# Patient Record
Sex: Female | Born: 1970 | Race: Asian | Hispanic: No | Marital: Married | Smoking: Never smoker
Health system: Southern US, Community
[De-identification: ages and names within clinical notes are randomized; demographics above are authoritative.]

## PROBLEM LIST (undated history)

## (undated) DIAGNOSIS — I10 Essential (primary) hypertension: Secondary | ICD-10-CM

## (undated) HISTORY — PX: TUBAL LIGATION: SHX77

---

## 2004-07-15 ENCOUNTER — Emergency Department (HOSPITAL_COMMUNITY): Admission: EM | Admit: 2004-07-15 | Discharge: 2004-07-15 | Payer: Self-pay | Admitting: Family Medicine

## 2006-07-22 ENCOUNTER — Inpatient Hospital Stay (HOSPITAL_COMMUNITY): Admission: AD | Admit: 2006-07-22 | Discharge: 2006-07-22 | Payer: Self-pay | Admitting: Obstetrics and Gynecology

## 2006-10-14 ENCOUNTER — Ambulatory Visit (HOSPITAL_COMMUNITY): Admission: RE | Admit: 2006-10-14 | Discharge: 2006-10-14 | Payer: Self-pay | Admitting: Family Medicine

## 2007-01-31 ENCOUNTER — Ambulatory Visit (HOSPITAL_COMMUNITY): Admission: RE | Admit: 2007-01-31 | Discharge: 2007-01-31 | Payer: Self-pay | Admitting: Obstetrics & Gynecology

## 2007-02-16 ENCOUNTER — Ambulatory Visit: Payer: Self-pay | Admitting: Obstetrics and Gynecology

## 2007-02-20 ENCOUNTER — Encounter: Payer: Self-pay | Admitting: Obstetrics and Gynecology

## 2007-02-20 ENCOUNTER — Ambulatory Visit: Payer: Self-pay | Admitting: Obstetrics and Gynecology

## 2007-02-20 ENCOUNTER — Inpatient Hospital Stay (HOSPITAL_COMMUNITY): Admission: AD | Admit: 2007-02-20 | Discharge: 2007-02-23 | Payer: Self-pay | Admitting: Obstetrics and Gynecology

## 2008-08-13 ENCOUNTER — Emergency Department (HOSPITAL_COMMUNITY): Admission: EM | Admit: 2008-08-13 | Discharge: 2008-08-13 | Payer: Self-pay | Admitting: Emergency Medicine

## 2008-08-25 IMAGING — US US OB COMP LESS 14 WK
1 series · 14 of 28 positions shown · non-contrast
Comparison: none

CLINICAL DATA: 35-year-old female with positive pregnancy test with estimated gestation of 5 weeks by LMP.  Pelvic pain and spotting.  
 OBSTETRICAL ULTRASOUND <14 WKS:
TECHNIQUE: Transabdominal ultrasound was performed for evaluation of the gestation as well as the maternal uterus and adnexal regions.

[Series 1: us ob comp less 14 wk · 0.22mm/px · 14 of 28 slices shown]
[im 2/28]
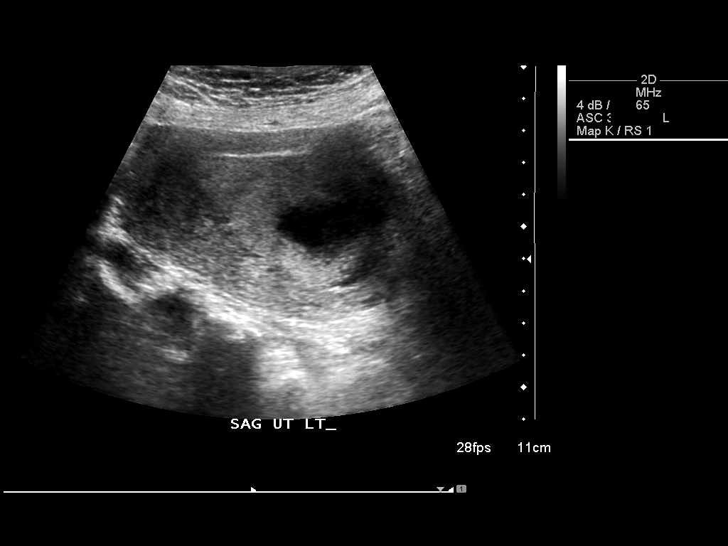
[im 4/28]
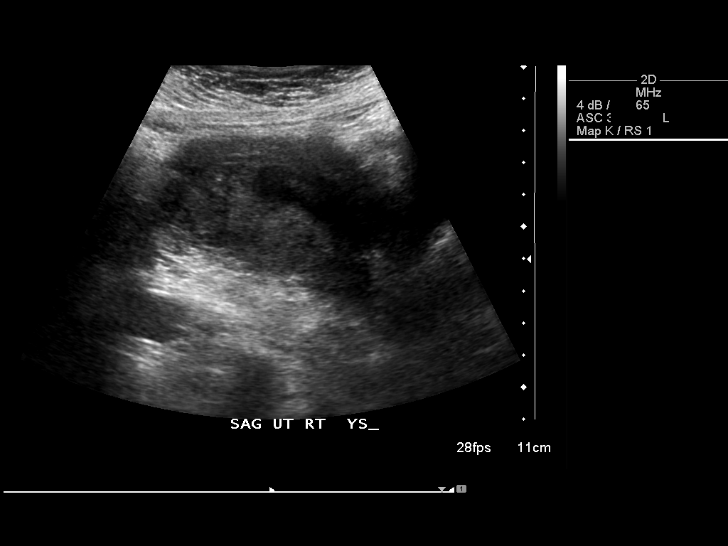
[im 6/28]
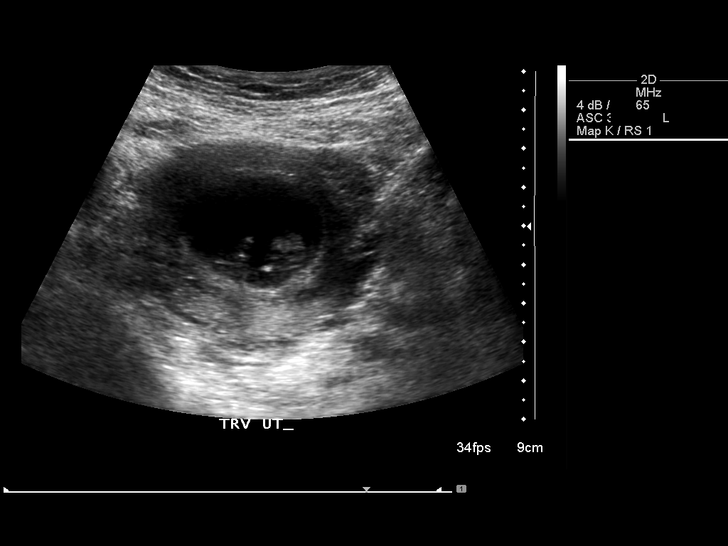
[im 8/28]
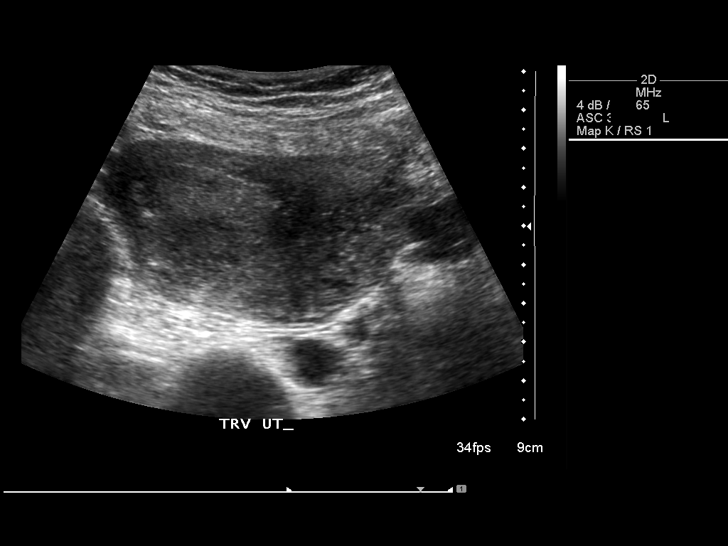
[im 10/28]
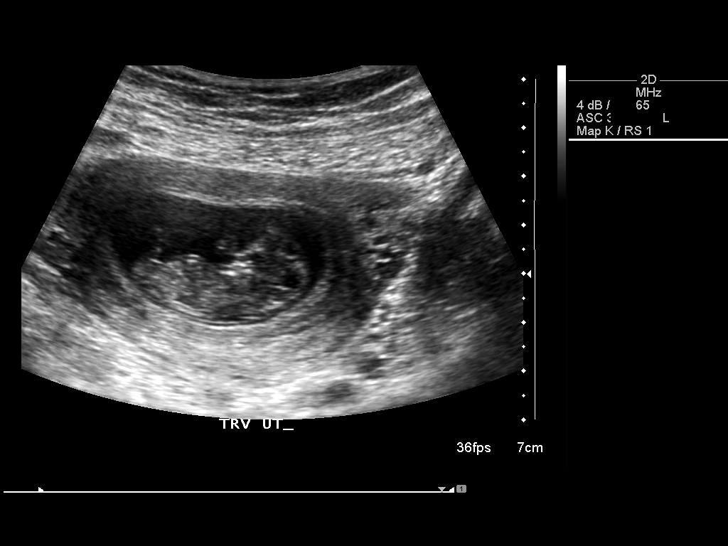
[im 12/28]
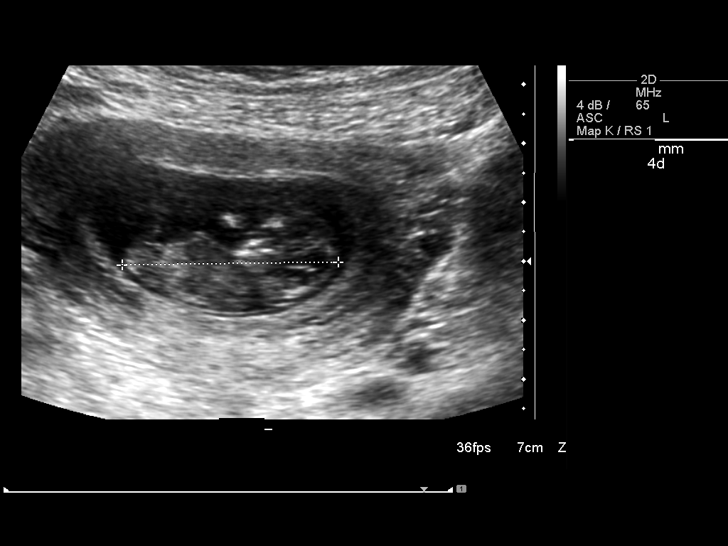
[im 14/28]
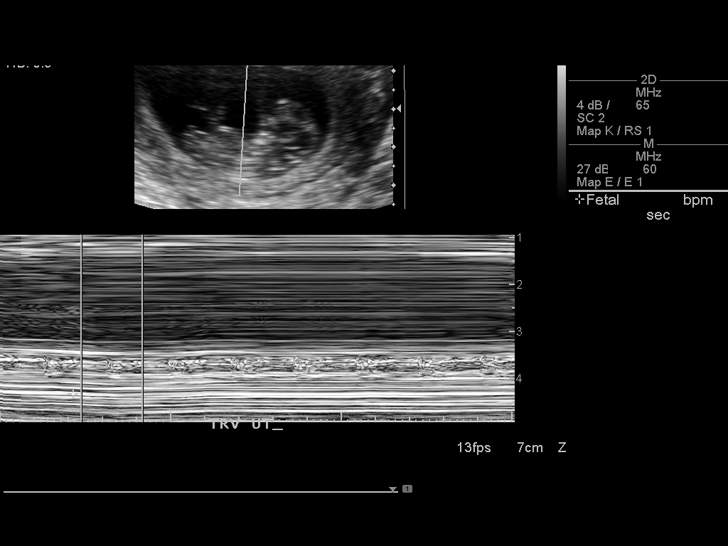
[im 16/28]
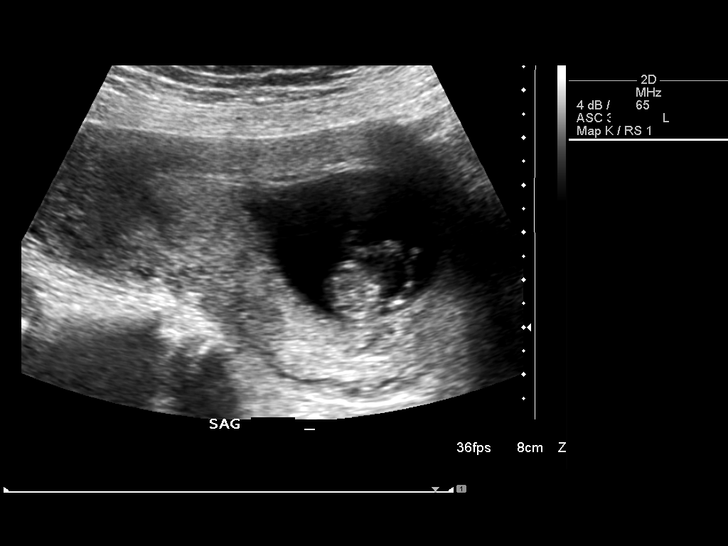
[im 18/28]
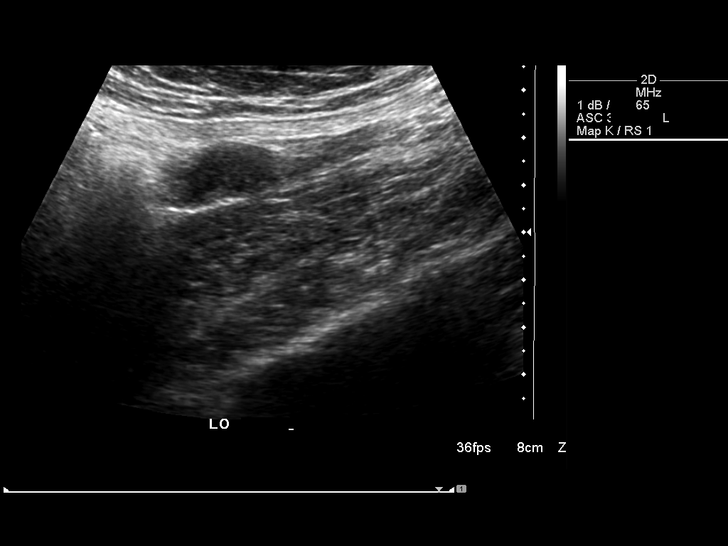
[im 20/28]
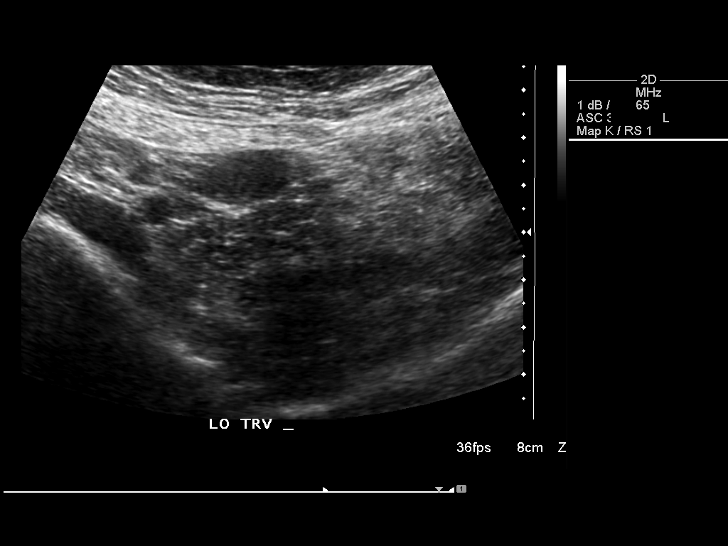
[im 22/28]
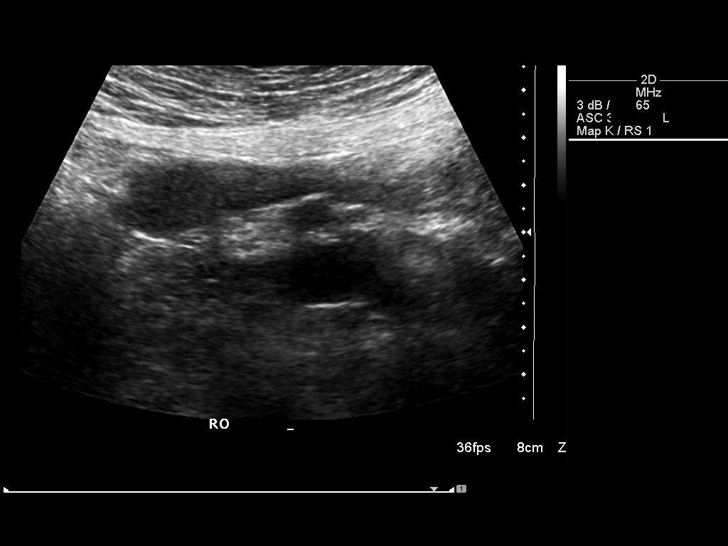
[im 24/28]
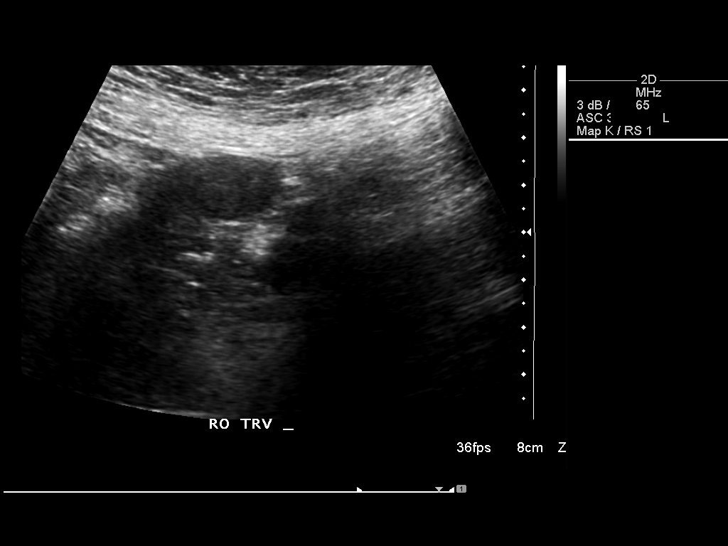
[im 26/28]
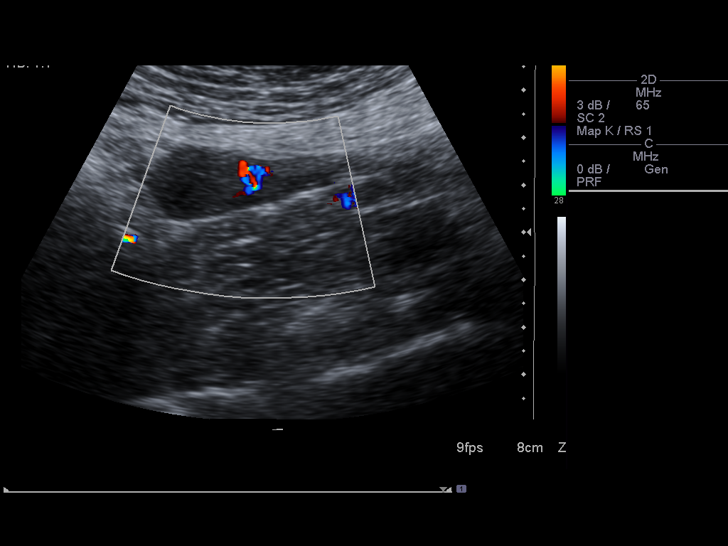
[im 28/28]
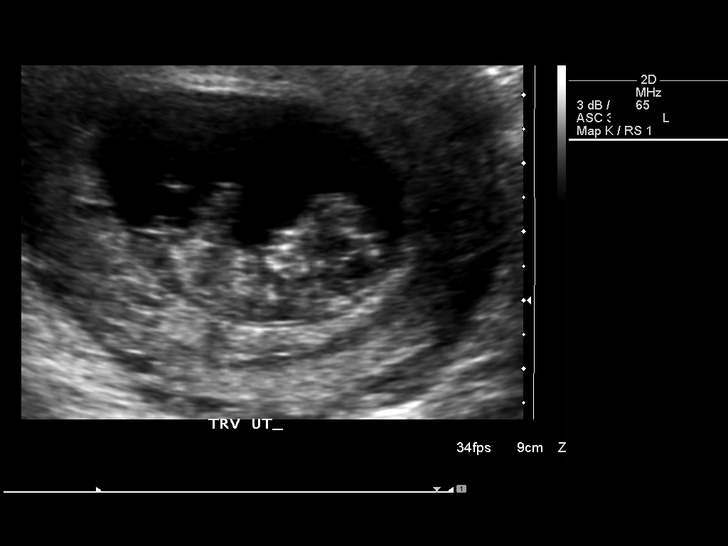

[14 of 28 positions shown; findings below may reference images not displayed]

FINDINGS: There is a single living intrauterine gestation with crown-rump length measuring 36.7 mm corresponding to a gestational age of 10 weeks 4 days.  Cardiac activity measures 168 bpm.  There is no evidence of subchorionic hemorrhage.  The ovaries bilaterally are unremarkable.  There is no evidence of adnexal mass or free fluid.
IMPRESSION: 1.  Single living intrauterine gestation with estimated gestational age of 10 weeks 4 days by this ultrasound.  
 2.  Recommend full anatomy screening at 20 weeks gestation.

## 2010-02-04 ENCOUNTER — Observation Stay (HOSPITAL_COMMUNITY)
Admission: EM | Admit: 2010-02-04 | Discharge: 2010-02-05 | Payer: Self-pay | Source: Home / Self Care | Attending: Internal Medicine | Admitting: Internal Medicine

## 2010-02-05 ENCOUNTER — Encounter: Payer: Self-pay | Admitting: Pulmonary Disease

## 2010-02-12 ENCOUNTER — Ambulatory Visit: Payer: Self-pay | Admitting: Pulmonary Disease

## 2010-03-20 NOTE — Consult Note (Signed)
Summary: [facility]  [place]   Imported By: Sherian Rein 02/12/2010 11:14:53  _____________________________________________________________________  External Attachment:    Type:   Image     Comment:   External Document

## 2010-03-26 DIAGNOSIS — I10 Essential (primary) hypertension: Secondary | ICD-10-CM | POA: Insufficient documentation

## 2010-03-26 DIAGNOSIS — R519 Headache, unspecified: Secondary | ICD-10-CM | POA: Insufficient documentation

## 2010-03-26 DIAGNOSIS — J189 Pneumonia, unspecified organism: Secondary | ICD-10-CM | POA: Insufficient documentation

## 2010-03-26 DIAGNOSIS — R51 Headache: Secondary | ICD-10-CM | POA: Insufficient documentation

## 2010-03-27 ENCOUNTER — Institutional Professional Consult (permissible substitution): Payer: Self-pay | Admitting: Pulmonary Disease

## 2010-03-28 ENCOUNTER — Telehealth: Payer: Self-pay | Admitting: Pulmonary Disease

## 2010-04-03 NOTE — Progress Notes (Signed)
Summary: nos appt  Phone Note Call from Patient   Caller: [email or web address]  Call For: Priest Lockridge Summary of Call: ATC pt to rsc nos from 2/9 phone disconnected. Initial call taken by: Darletta Moll,  March 28, 2010 9:41 AM

## 2010-04-28 LAB — CBC
HCT: 39.4 % (ref 36.0–46.0)
MCHC: 33.5 g/dL (ref 30.0–36.0)
MCV: 88.9 fL (ref 78.0–100.0)
Platelets: 204 10*3/uL (ref 150–400)
Platelets: 230 10*3/uL (ref 150–400)
RBC: 4.76 MIL/uL (ref 3.87–5.11)
RDW: 12.9 % (ref 11.5–15.5)
WBC: 8.3 10*3/uL (ref 4.0–10.5)
WBC: 8.7 10*3/uL (ref 4.0–10.5)

## 2010-04-28 LAB — DIFFERENTIAL
Eosinophils Relative: 3 % (ref 0–5)
Lymphocytes Relative: 37 % (ref 12–46)
Lymphs Abs: 3 10*3/uL (ref 0.7–4.0)
Monocytes Absolute: 1 10*3/uL (ref 0.1–1.0)
Neutro Abs: 4 10*3/uL (ref 1.7–7.7)

## 2010-04-28 LAB — COMPREHENSIVE METABOLIC PANEL
ALT: 24 U/L (ref 0–35)
AST: 21 U/L (ref 0–37)
AST: 25 U/L (ref 0–37)
Albumin: 3.4 g/dL — ABNORMAL LOW (ref 3.5–5.2)
Albumin: 3.9 g/dL (ref 3.5–5.2)
Alkaline Phosphatase: 44 U/L (ref 39–117)
Calcium: 8.6 mg/dL (ref 8.4–10.5)
Calcium: 9.3 mg/dL (ref 8.4–10.5)
Creatinine, Ser: 0.73 mg/dL (ref 0.4–1.2)
GFR calc Af Amer: >60 mL/min (ref >60)
GFR calc Af Amer: >60 mL/min (ref >60)
Glucose, Bld: 105 mg/dL — ABNORMAL HIGH (ref 70–99)
Potassium: 3.7 mEq/L (ref 3.5–5.1)
Sodium: 138 mEq/L (ref 135–145)
Total Protein: 7.2 g/dL (ref 6.0–8.3)

## 2010-04-28 LAB — RAPID URINE DRUG SCREEN, HOSP PERFORMED
Amphetamines: NOT DETECTED
Barbiturates: NOT DETECTED
Benzodiazepines: NOT DETECTED
Cocaine: NOT DETECTED
Opiates: POSITIVE — AB
Tetrahydrocannabinol: NOT DETECTED

## 2010-04-28 LAB — CARDIAC PANEL(CRET KIN+CKTOT+MB+TROPI)
CK, MB: 0.7 ng/mL (ref 0.3–4.0)
CK, MB: 0.8 ng/mL (ref 0.3–4.0)
Total CK: 71 U/L (ref 7–177)
Troponin I: <0.01 ng/mL (ref 0.00–0.06)
Troponin I: <0.01 ng/mL (ref 0.00–0.06)

## 2010-04-28 LAB — URINALYSIS, ROUTINE W REFLEX MICROSCOPIC
Glucose, UA: NEGATIVE mg/dL
Nitrite: NEGATIVE
Protein, ur: NEGATIVE mg/dL
pH: 7.5 (ref 5.0–8.0)

## 2010-04-28 LAB — CK TOTAL AND CKMB (NOT AT ARMC)
CK, MB: 1 ng/mL (ref 0.3–4.0)
Relative Index: INVALID (ref 0.0–2.5)
Total CK: 92 U/L (ref 7–177)

## 2010-04-28 LAB — TROPONIN I: Troponin I: 0.01 ng/mL (ref 0.00–0.06)

## 2010-04-28 LAB — D-DIMER, QUANTITATIVE: D-Dimer, Quant: 1.22 ug/mL-FEU — ABNORMAL HIGH (ref 0.00–0.48)

## 2010-05-26 LAB — POCT I-STAT, CHEM 8
Chloride: 105 mEq/L (ref 96–112)
HCT: 45 % (ref 36.0–46.0)
Potassium: 3.7 mEq/L (ref 3.5–5.1)
Sodium: 140 mEq/L (ref 135–145)

## 2010-05-26 LAB — URINALYSIS, ROUTINE W REFLEX MICROSCOPIC
Ketones, ur: NEGATIVE mg/dL
Nitrite: NEGATIVE
Protein, ur: NEGATIVE mg/dL
Urobilinogen, UA: 0.2 mg/dL (ref 0.0–1.0)
pH: 6 (ref 5.0–8.0)

## 2010-05-26 LAB — CBC
MCHC: 34.5 g/dL (ref 30.0–36.0)
Platelets: 284 10*3/uL (ref 150–400)
RBC: 4.84 MIL/uL (ref 3.87–5.11)
RDW: 13.3 % (ref 11.5–15.5)

## 2010-05-26 LAB — DIFFERENTIAL
Basophils Absolute: 0 10*3/uL (ref 0.0–0.1)
Basophils Relative: 0 % (ref 0–1)
Neutro Abs: 6.8 10*3/uL (ref 1.7–7.7)
Neutrophils Relative %: 62 % (ref 43–77)

## 2010-07-01 NOTE — Op Note (Signed)
[name], [name]                   ACCOUNT NO.:  [id number]   MEDICAL RECORD NO.:  [id number]          PATIENT TYPE:  INP   LOCATION:  9102                          FACILITY:  [facility]   PHYSICIAN:  Tilda Burrow, M.D. DATE OF BIRTH:  01-25-1971   DATE OF PROCEDURE:  02/20/2007  DATE OF DISCHARGE:                               OPERATIVE REPORT   PREOPERATIVE DIAGNOSIS:  Pregnancy at [redacted] weeks gestation, active labor,  cephalopelvic disproportion, elective sterilization.   POSTOPERATIVE DIAGNOSIS:  Pregnancy at [redacted] weeks gestation, active labor,  cephalopelvic disproportion, elective sterilization, delivered.   PROCEDURE:  Primary low transverse cesarean section and bilateral  partial salpingectomy.   SURGEON:  Tilda Burrow, M.D.   ASSISTANT:  None.   ANESTHESIA:  Epidural x2.   COMPLICATIONS:  Inadequate analgesia requiring replacement of the  epidural catheter with catheter threaded x2 in the OR because the first  time entered an epidural  vein and was not utilized therefore.   DESCRIPTION OF PROCEDURE:  The patient was taken to the operating room  after arrest of labor at complete, complete, 0 station with pushing  descent to +2 only.  Epidural analgesia was ineffective until the spinal  was repeated as per anesthesia's notes.  Good analgesic block was  effective and then a Pfannenstiel type incision performed with  peritoneal cavity entered easily in the midline and bladder flap  retracted inferiorly on the lower uterine segment which was quite well-  developed.  A transverse uterine incision was made at the level of the  fetal ear, opening the uterus without difficulty and the fetal vertex  easily rotated into the incision and the baby was delivered by fundal  pressure.  The cord was clamped and the infant was passed to awaiting  pediatricians.  There was light meconium discoloration of fluid.  The  baby cried vigorously while still on the maternal abdomen and peds did  not have to do laryngoscopy.  The placenta was delivered intact [place]  presentation and membranes intact.  After cord blood samples were  obtained, blood gases were not considered necessary due to the excellent  status of the baby.  The placenta was sent to labor and delivery for  refrigeration.  The uterus was grasped with ring forceps, cleansed  internally, and closed.  There was a two layer closure, running locking  first layer, and continuous running second layer.  On the left side  there was an area of bleeding on the inferior aspects of the incision  just in front of the uterine vessels, catching one of the smaller  arteries in the area which caused oozing sufficient to require a couple  of extra figure-of-eight sutures placed carefully in the anterior  portion of the inferior aspects of the uterine incision.  Hemostasis was  satisfactory at the end of the procedure and attention directed to the  tubal.   Tubal ligation was performed by grasping the middle of the fallopian  tube on the right, identifying it to the fimbriated end, doubly ligating  around the midsegment knuckle of tube which  was then excised and  hemostasis of the pedicle confirmed.  The left side was treated  similarly and each specimen sent separately to pathology.  Abdomen was  swabbed to remove blood from the upper abdomen.  The patient had  received IV antibiotics upon arrival to the OR.  The bladder flap was  then loosely reapproximated with three interrupted sutures of 2-0 plain  and then the anterior peritoneum closed with a running 2-0 chromic.  The  fascia was closed with a continuous running 0 Vicryl.  Subcutaneous  tissues were irrigated and closed with interrupted 2-0 plain x3 sutures  and then staple closure of the skin completed the procedure.  The  patient tolerated the procedure well and went to the recovery room in  good condition.  Needle, sponge, and instrument counts correct.      Tilda Burrow, M.D.  Electronically Signed     JVF/MEDQ  D:  02/20/2007  T:  02/21/2007  Job:  [id number]

## 2010-07-01 NOTE — Discharge Summary (Signed)
NAMECARALYN, [name]                   ACCOUNT NO.:  [id number]   MEDICAL RECORD NO.:  [id number]          PATIENT TYPE:  INP   LOCATION:  9102                          FACILITY:  [facility]   PHYSICIAN:  Tanya S. Shawnie Pons, M.D.   DATE OF BIRTH:  1970/12/26   DATE OF ADMISSION:  02/20/2007  DATE OF DISCHARGE:  02/23/2007                               DISCHARGE SUMMARY   DISCHARGE DIAGNOSES:  1. Primary lower tranverse cesarean section for cephalopelvic      disproportion and failure to descend.  2. Term pregnancy delivered at 41 weeks.   Patient is a 40 year old G3, P3 at 41 weeks presented in labor who  attempted to have a vaginal delivery and reached completed dilation;  however, the baby did not descend enough to allow vaginal delivery  despite adequate contractions.  Patient underwent a successful primary  lower transverse Cesarean section with epidural anesthesia, 3-vessel  cord.  Estimated blood loss 600 to 800 mL, Apgar's see surgery note.  Mom is breastfeeding.  Mom is status post BTO on 02/19/2006.  Her blood  type is O+.  Rubella immune.   DISCHARGE MEDICATIONS:  1. Percocet 5/325 one tab p.o. every 4 hours p.r.n. moderate to severe      pain.  2. Ibuprofen 800 mg 1 tab p.o. every 6 hours p.r.n. pain.  3. Colace 100 mg p.o. b.i.d.  4. Prenatal vitamins 1 tab p.o. daily while breastfeeding.   FOLLOWUP:  Patient's followup instructions, nothing per vagina for 6  weeks.  Patient not to lift greater than the weight of the baby for 2  weeks.  She will follow up at the health department in 6 weeks.      Altamese Cabal, M.D.      Shelbie Proctor. Shawnie Pons, M.D.  Electronically Signed    KS/MEDQ  D:  02/23/2007  T:  02/23/2007  Job:  [id number]

## 2010-11-06 LAB — RPR: RPR Ser Ql: NONREACTIVE

## 2010-11-06 LAB — CBC
HCT: 30.9 — ABNORMAL LOW
HCT: 38.4
Hemoglobin: 13.2
MCV: 89.7
RBC: 3.45 — ABNORMAL LOW
WBC: 17.5 — ABNORMAL HIGH
WBC: 8.4

## 2010-12-04 LAB — WET PREP, GENITAL
Clue Cells Wet Prep HPF POC: NONE SEEN
Yeast Wet Prep HPF POC: NONE SEEN

## 2010-12-04 LAB — CBC
HCT: 35.2 — ABNORMAL LOW
MCHC: 33.9
Platelets: 255
RDW: 12.9

## 2010-12-04 LAB — POCT PREGNANCY, URINE: Operator id: 12584

## 2010-12-04 LAB — GC/CHLAMYDIA PROBE AMP, GENITAL
Chlamydia, DNA Probe: NEGATIVE
GC Probe Amp, Genital: NEGATIVE

## 2013-05-01 ENCOUNTER — Emergency Department (HOSPITAL_COMMUNITY): Payer: BC Managed Care – PPO

## 2013-05-01 ENCOUNTER — Emergency Department (HOSPITAL_COMMUNITY)
Admission: EM | Admit: 2013-05-01 | Discharge: 2013-05-01 | Disposition: A | Discharge status: Home / Self Care | Payer: BC Managed Care – PPO | Attending: Emergency Medicine | Admitting: Emergency Medicine

## 2013-05-01 ENCOUNTER — Encounter (HOSPITAL_COMMUNITY): Payer: Self-pay | Admitting: Emergency Medicine

## 2013-05-01 DIAGNOSIS — Z3202 Encounter for pregnancy test, result negative: Secondary | ICD-10-CM | POA: Insufficient documentation

## 2013-05-01 DIAGNOSIS — R109 Unspecified abdominal pain: Secondary | ICD-10-CM | POA: Insufficient documentation

## 2013-05-01 DIAGNOSIS — I10 Essential (primary) hypertension: Secondary | ICD-10-CM | POA: Insufficient documentation

## 2013-05-01 DIAGNOSIS — Z9851 Tubal ligation status: Secondary | ICD-10-CM | POA: Insufficient documentation

## 2013-05-01 DIAGNOSIS — R110 Nausea: Secondary | ICD-10-CM | POA: Insufficient documentation

## 2013-05-01 HISTORY — DX: Essential (primary) hypertension: I10

## 2013-05-01 LAB — URINALYSIS, ROUTINE W REFLEX MICROSCOPIC
BILIRUBIN URINE: NEGATIVE
Glucose, UA: NEGATIVE mg/dL
Hgb urine dipstick: NEGATIVE
KETONES UR: NEGATIVE mg/dL
NITRITE: NEGATIVE
PH: 6 (ref 5.0–8.0)
Protein, ur: NEGATIVE mg/dL
Specific Gravity, Urine: 1.011 (ref 1.005–1.030)
UROBILINOGEN UA: 0.2 mg/dL (ref 0.0–1.0)

## 2013-05-01 LAB — COMPREHENSIVE METABOLIC PANEL
ALK PHOS: 52 U/L (ref 39–117)
ALT: 14 U/L (ref 0–35)
AST: 16 U/L (ref 0–37)
Albumin: 3.9 g/dL (ref 3.5–5.2)
BILIRUBIN TOTAL: 0.3 mg/dL (ref 0.3–1.2)
BUN: 11 mg/dL (ref 6–23)
CHLORIDE: 101 meq/L (ref 96–112)
CO2: 22 meq/L (ref 19–32)
Calcium: 9 mg/dL (ref 8.4–10.5)
Creatinine, Ser: 0.56 mg/dL (ref 0.50–1.10)
GFR calc non Af Amer: >90 mL/min (ref >90)
GLUCOSE: 98 mg/dL (ref 70–99)
POTASSIUM: 4 meq/L (ref 3.7–5.3)
Sodium: 137 mEq/L (ref 137–147)
Total Protein: 7.5 g/dL (ref 6.0–8.3)

## 2013-05-01 LAB — CBC WITH DIFFERENTIAL/PLATELET
Basophils Absolute: 0 10*3/uL (ref 0.0–0.1)
Basophils Relative: 0 % (ref 0–1)
Eosinophils Absolute: 0.2 10*3/uL (ref 0.0–0.7)
Eosinophils Relative: 3 % (ref 0–5)
HCT: 40.9 % (ref 36.0–46.0)
HEMOGLOBIN: 14.2 g/dL (ref 12.0–15.0)
LYMPHS ABS: 1.9 10*3/uL (ref 0.7–4.0)
Lymphocytes Relative: 25 % (ref 12–46)
MCH: 30.4 pg (ref 26.0–34.0)
MCHC: 34.7 g/dL (ref 30.0–36.0)
MCV: 87.6 fL (ref 78.0–100.0)
MONOS PCT: 7 % (ref 3–12)
Monocytes Absolute: 0.6 10*3/uL (ref 0.1–1.0)
NEUTROS ABS: 5 10*3/uL (ref 1.7–7.7)
NEUTROS PCT: 65 % (ref 43–77)
Platelets: 244 10*3/uL (ref 150–400)
RBC: 4.67 MIL/uL (ref 3.87–5.11)
RDW: 12.6 % (ref 11.5–15.5)
WBC: 7.6 10*3/uL (ref 4.0–10.5)

## 2013-05-01 LAB — URINE MICROSCOPIC-ADD ON

## 2013-05-01 LAB — PREGNANCY, URINE: Preg Test, Ur: NEGATIVE

## 2013-05-01 LAB — LIPASE, BLOOD: LIPASE: 14 U/L (ref 11–59)

## 2013-05-01 MED ORDER — HYDROMORPHONE HCL PF 1 MG/ML IJ SOLN
1.0000 mg | Freq: Once | INTRAMUSCULAR | Status: AC
  Administered 2013-05-01: 1 mg via INTRAVENOUS
  Filled 2013-05-01: qty 1

## 2013-05-01 MED ORDER — OXYCODONE-ACETAMINOPHEN 5-325 MG PO TABS: 1.0000 | ORAL_TABLET | ORAL | Status: DC | PRN

## 2013-05-01 MED ORDER — SODIUM CHLORIDE 0.9 % IV BOLUS (SEPSIS)
1000.0000 mL | Freq: Once | INTRAVENOUS | Status: AC
  Administered 2013-05-01: 1000 mL via INTRAVENOUS

## 2013-05-01 MED ORDER — ONDANSETRON HCL 4 MG/2ML IJ SOLN
4.0000 mg | Freq: Once | INTRAMUSCULAR | Status: AC
  Administered 2013-05-01: 4 mg via INTRAVENOUS
  Filled 2013-05-01: qty 2

## 2013-05-01 NOTE — ED Notes (Signed)
Patient complaining of left sided abdominal pain in the LUQ. Pain started this morning and increases with breathing. Patient states she has had the pain in the past, but the pain has subsided.

## 2013-05-01 NOTE — ED Provider Notes (Signed)
CSN: [id number]     Arrival date & time 05/01/13  [id number] History   First MD Initiated Contact with Patient 05/01/13 0831    Level V caveat do to language barrier I was unable to obtain a interpreter as the patient speaks [place].  We attempted a [place] interpreter but were unsuccessful subsequently her daughter acted as Copywriter, advertisinginterpreter Chief Complaint  Patient presents with  . Abdominal Pain   43 year old female with a history of hypertension who was well on awakening this morning and went to work. After going to work she began having left flank pain. She called her daughter and came to the emergency department secondary to this. She describes the pain as being in the left flank area and severe and sharp in nature. She feels like it improves if she lays down and is still. It does feel like it worsens to her if she moves around. She has had nausea associated with it but no vomiting. She had one similar episode last week which lasted about 8 hours but came and went during that time that it was not as severe. She did not seek medical attention during that time. She has regular menses. She has had her tubes tied. Her last menstrual cycle was February 18 with a normal menstrual cycle for her. She denies any abnormal vaginal discharge G4 P0 status post bilateral tubal ligation. She denies any urinary tract abnormal symptoms including frequency, pain, dark urine, or hematuria. She has not had any fever or chills. She states the pain does increase with deep breathing. She denies any history of DVT or pulmonary embolism.  (Consider location/radiation/quality/duration/timing/severity/associated sxs/prior Treatment) HPI  Past Medical History  Diagnosis Date  . Hypertension    Past Surgical History  Procedure Laterality Date  . Cesarean section    . Tubal ligation     History reviewed. No pertinent family history. History  Substance Use Topics  . Smoking status: Never Smoker   . Smokeless tobacco: Not on  file  . Alcohol Use: No   OB History   Grav Para Term Preterm Abortions TAB SAB Ect Mult Living   4 4 4  0 0 0 0 0 0 3     Review of Systems  All other systems reviewed and are negative.      Allergies  Review of patient's allergies indicates no known allergies.  Home Medications   Current Outpatient Rx  Name  Route  Sig  Dispense  Refill  . naproxen sodium (ANAPROX) 220 MG tablet   Oral   Take 220 mg by mouth 2 (two) times daily as needed (for pain).          BP 168/79  Pulse 65  Temp(Src) 98 F (36.7 C) (Oral)  Resp 16  SpO2 98%  LMP 04/05/2013 Physical Exam  Nursing note and vitals reviewed. Constitutional: She is oriented to person, place, and time. She appears well-developed and well-nourished.  HENT:  Head: Normocephalic and atraumatic.  Right Ear: External ear normal.  Left Ear: External ear normal.  Nose: Nose normal.  Mouth/Throat: Oropharynx is clear and moist.  Eyes: Conjunctivae and EOM are normal. Pupils are equal, round, and reactive to light.  Neck: Normal range of motion. Neck supple.  Cardiovascular: Normal rate, regular rhythm, normal heart sounds and intact distal pulses.   Pulmonary/Chest: Effort normal and breath sounds normal.  Abdominal: Soft. Bowel sounds are normal. She exhibits no mass. There is tenderness. There is no guarding.  Musculoskeletal: Normal range of motion.  Neurological: She is alert and oriented to person, place, and time. She has normal reflexes.  Skin: Skin is warm and dry.  Psychiatric: She has a normal mood and affect. Her behavior is normal. Judgment and thought content normal.    ED Course  Procedures (including critical care time) Labs Review Labs Reviewed  URINALYSIS, ROUTINE W REFLEX MICROSCOPIC - Abnormal; Notable for the following:    Color, Urine STRAW (*)    APPearance HAZY (*)    Leukocytes, UA SMALL (*)    All other components within normal limits  URINE MICROSCOPIC-ADD ON - Abnormal; Notable for  the following:    Squamous Epithelial / LPF MANY (*)    Bacteria, UA FEW (*)    All other components within normal limits  CBC WITH DIFFERENTIAL  COMPREHENSIVE METABOLIC PANEL  LIPASE, BLOOD  PREGNANCY, URINE   Imaging Review Ct Abdomen Pelvis Wo Contrast  05/01/2013   CLINICAL DATA:  Left upper quadrant pain  EXAM: CT ABDOMEN AND PELVIS WITHOUT CONTRAST  TECHNIQUE: Multidetector CT imaging of the abdomen and pelvis was performed following the standard protocol without intravenous contrast.  COMPARISON:  08/13/2008  FINDINGS: Sagittal images of the spine shows mild degenerative changes thoracolumbar spine.  Lung bases shows stable pleural based nodule in right lower lobe measures 5 mm.  Unenhanced liver shows no biliary ductal dilatation. The study is limited without IV contrast. No calcified gallstones are noted within gallbladder. Unenhanced pancreas, spleen and adrenal glands are unremarkable. Unenhanced kidneys are symmetrical in size. Cyst in upper pole of the left kidney posteriorly measures 7 mm stable in size. A cyst in midpole of the left kidney posteriorly measures 1.6 cm increased in size from prior exam. There is a cyst in lower pole of the left kidney measures 2 cm. There is progression in size from prior exam. Central cyst in midpole of the left kidney measures 1 cm stable from prior exam. No nephrolithiasis. No hydronephrosis or hydroureter.  No pericecal inflammation. Moderate stool in right colon and transverse colon. The terminal ileum is unremarkable. Normal appendix.  Mild distended urinary bladder. The unenhanced uterus is grossly unremarkable. No adnexal masses noted. No calcified calculi are noted within the bladder.  IMPRESSION: 1. No nephrolithiasis. No hydronephrosis or hydroureter. Progression in size of left renal cysts. 2. No calcified ureteral calculi are noted. 3. Moderate stool in right colon and transverse colon. No pericecal inflammation. Normal appendix. 4. No calcified  calculi are noted within urinary bladder. The urinary bladder is mild distended.   Electronically Signed   By: Natasha Mead M.D.   On: 05/01/2013 11:29     EKG Interpretation None      MDM   Final diagnoses:  None   Patient feels improved but still complains of left side/flank pain with movement. Unclear etiology but no apparent kidney stone, uti, diverticulitis, or aortic abnormality.  Patient may have musculoskeletal etiology.  Patient advised through daughter to return if any worsening or not better.  Plan percocet prn #ten.     Hilario Quarry, MD 05/01/13 [phone]

## 2013-05-01 NOTE — Discharge Instructions (Signed)
?  au b?ng, Ng??i l?n °(Abdominal Pain, Adult) °Có nhi?u nguyên nhân d?n ??n ?au b?ng. Thông th??ng ?au b?ng là do m?t b?nh gây ra và s? không ?? n?u không ?i?u tr?. B?nh này có th? ???c theo dõi và ?i?u tr? t?i nhà. Chuyên gia ch?m sóc s?c kh?e s? ti?n hành khám th?c th? và có th? yêu c?u làm xét nghi?m máu và ch?p X quang ?? xác ??nh m?c ?? nghiêm tr?ng c?a c?n ?au b?ng. Tuy nhiên, trong nhi?u tr??ng h?p, ph?i m?t nhi?u th?i gian h?n ?? xác ??nh rõ nguyên nhân gây ?au b?ng. Tr??c khi tìm ra nguyên nhân, chuyên gia ch?m sóc s?c kh?e có th? không bi?t li?u quý v? có c?n làm thêm xét nghi?m ho?c ti?p t?c ?i?u tr? hay không. °H??NG D?N CH?M SÓC T?I NHÀ  °Theo dõi c?n ?au b?ng xem có b?t k? thay ??i nào không. Nh?ng hành ??ng sau có th? giúp lo?i b? b?t c? c?m giác khó ch?u nào quý v? ?ang b?. °· Ch? s? d?ng thu?c không c?n kê ??n ho?c thu?c c?n kê ??n theo ch? d?n c?a chuyên gia ch?m sóc s?c kh?e. °· Không dùng thu?c nhu?n tràng tr? khi ???c chuyên gia ch?m sóc s?c kh?e ch? ??nh. °· Th? dùng m?t ch? ?? ?n l?ng (n??c lu?c th?t, trà, ho?c n??c) theo ch? d?n c?a chuyên gia ch?m sóc s?c kh?e. Chuy?n d?n sang m?t ch? ?? ?n nh? n?u ch?u ???c. °?I KHÁM N?U: °· Quý v? b? ?au b?ng không rõ nguyên nhân. °· Quý v? b? ?au b?ng kèm theo bu?n nôn ho?c tiêu ch?y. °· Quý v? b? ?au khi ?i ti?u ho?c ?i ngoài. °· Quý v? b? c?n ?au b?ng làm th?c gi?c vào ban ?êm. °· Quý v? b? ?au b?ng n?ng thêm ho?c ?? h?n khi ?n. °· Quý v? b? ?au b?ng n?ng thêm khi ?n ?? ?n nhi?u ch?t béo. °NGAY L?P T?C ?I KHÁM N?U:  °· C?n ?au không kh?i trong vòng 2 gi?. °· Quý v? b? s?t. °· Quý v? v?n ti?p t?c b? ói (nôn m?a). °· Ch? có th? c?m th?y ?au ? m?t s? ph?n b?ng, ch?ng h?n nh? ? ph?n b?ng bên ph?i ho?c bên d??i trái. °· Phân c?a quý v? có máu ho?c có màu ?en nh? h?c ín. °??M B?O QUÝ V?: °· Hi?u rõ các h??ng d?n này. °· S? theo dõi tình tr?ng c?a mình. °· S? yêu c?u tr? giúp ngay l?p t?c n?u quý v? c?m th?y không kh?e ho?c th?y tr?m tr?ng h?n. °Document  Released: 02/02/2005 Document Revised: 11/23/2012 °Exit[employer]® Patient Information ©2014 [employer]. ° °

## 2013-05-01 NOTE — ED Notes (Signed)
Patient asked for urine sample. Patient cannot urinate at this time.

## 2013-05-01 NOTE — ED Notes (Signed)
Patient returned from CT

## 2013-05-01 NOTE — ED Notes (Signed)
Patient taken to CT.

## 2013-12-18 ENCOUNTER — Encounter (HOSPITAL_COMMUNITY): Payer: Self-pay | Admitting: Emergency Medicine

## 2014-07-10 ENCOUNTER — Ambulatory Visit (HOSPITAL_BASED_OUTPATIENT_CLINIC_OR_DEPARTMENT_OTHER)
Admission: RE | Admit: 2014-07-10 | Discharge: 2014-07-10 | Disposition: A | Discharge status: Home / Self Care | Payer: BLUE CROSS/BLUE SHIELD | Source: Ambulatory Visit | Attending: Family Medicine | Admitting: Family Medicine

## 2014-07-10 ENCOUNTER — Encounter: Payer: Self-pay | Admitting: Family Medicine

## 2014-07-10 ENCOUNTER — Ambulatory Visit (INDEPENDENT_AMBULATORY_CARE_PROVIDER_SITE_OTHER): Payer: BLUE CROSS/BLUE SHIELD | Admitting: Family Medicine

## 2014-07-10 ENCOUNTER — Encounter (INDEPENDENT_AMBULATORY_CARE_PROVIDER_SITE_OTHER): Payer: Self-pay

## 2014-07-10 VITALS — BP 135/79 | HR 56 | Ht 59.0 in | Wt 145.0 lb

## 2014-07-10 DIAGNOSIS — M542 Cervicalgia: Secondary | ICD-10-CM | POA: Diagnosis not present

## 2014-07-10 DIAGNOSIS — M549 Dorsalgia, unspecified: Secondary | ICD-10-CM

## 2014-07-10 DIAGNOSIS — M546 Pain in thoracic spine: Secondary | ICD-10-CM | POA: Insufficient documentation

## 2014-07-10 DIAGNOSIS — M5032 Other cervical disc degeneration, mid-cervical region: Secondary | ICD-10-CM | POA: Insufficient documentation

## 2014-07-10 MED ORDER — PREDNISONE 10 MG PO TABS: ORAL_TABLET | ORAL | Status: DC

## 2014-07-10 MED ORDER — DICLOFENAC SODIUM 75 MG PO TBEC: 75.0000 mg | DELAYED_RELEASE_TABLET | Freq: Two times a day (BID) | ORAL | Status: DC

## 2014-07-10 NOTE — Patient Instructions (Signed)
Get x-rays of your neck and back - if you want you can stick around and we can give you results or we can call you with the results. The numbness into your arms suggests spinal stenosis of the neck. Take prednisone 6 day dose pack to relieve irritation/inflammation of the nerve. Diclofenac with food for pain and inflammation - start day AFTER finishing prednisone if prescribed this. Start physical therapy for stretching, exercises, traction, and modalities of neck and upper back. Heat 15 minutes at a time 3-4 times a day to help with spasms. Watch head position when on computers, texting, when sleeping in bed - should in line with back to prevent further nerve traction and irritation. Follow up with me in 6 weeks.

## 2014-07-11 DIAGNOSIS — M549 Dorsalgia, unspecified: Secondary | ICD-10-CM | POA: Insufficient documentation

## 2014-07-11 NOTE — Progress Notes (Signed)
PCP: No primary care provider on file.  Subjective:   HPI: Patient is a 44 y.o. female here for neck, back pain.  Patient reports she's had over 3-4 years of upper back pain that seemed to start following her cesarean section. No known injury or trauma. Was 'given a lot of medication' during her cesarean section but nothing unusual they are aware of or any other complications with this procedure. Has not tried physical therapy, any medications for this. Also reports numbness into both arms and hands when holding something - this is intermittent and not present unless holding something. No bowel/bladder dysfunction.  Past Medical History  Diagnosis Date  . Hypertension     Current Outpatient Prescriptions on File Prior to Visit  Medication Sig Dispense Refill  . naproxen sodium (ANAPROX) 220 MG tablet Take 220 mg by mouth 2 (two) times daily as needed (for pain).    Marland Kitchen. oxyCODONE-acetaminophen (PERCOCET/ROXICET) 5-325 MG per tablet Take 1 tablet by mouth every 4 (four) hours as needed for severe pain. 10 tablet 0   No current facility-administered medications on file prior to visit.    Past Surgical History  Procedure Laterality Date  . Cesarean section    . Tubal ligation      No Known Allergies  History   Social History  . Marital Status: Married    Spouse Name: N/A  . Number of Children: N/A  . Years of Education: N/A   Occupational History  . Not on file.   Social History Main Topics  . Smoking status: Never Smoker   . Smokeless tobacco: Not on file  . Alcohol Use: No  . Drug Use: Not on file  . Sexual Activity: Not on file   Other Topics Concern  . Not on file   Social History Narrative    No family history on file.  BP 135/79 mmHg  Pulse 56  Ht 4\' 11"  (1.499 m)  Wt 145 lb (65.772 kg)  BMI 29.27 kg/m2  LMP 07/01/2014  Review of Systems: See HPI above.    Objective:  Physical Exam:  Gen: NAD  Neck/upper back: No gross deformity, swelling,  bruising. TTP upper thoracic medial to scapulae and bilateral cervical paraspinal regions.  No midline/bony TTP. FROM neck - pain with flexion. BUE strength 5/5. Sensation intact to light touch currently. Negative spurlings. NV intact distal BUEs.  Assessment & Plan:  1. Neck/upper back pain - upper back pain consistent with thoracic/trapezius strain, spasms.  She's getting numbness into entire extremities which could indicate spinal stenosis.  Radiographs reassuring of cervical and thoracic spines.  Start with conservative treatment.  Prednisone dose pack and transition to diclofenac.  Start physical therapy, home exercises.  Heat for spasms.  Discussed ergonomic issues.  F/u in 6 weeks.

## 2014-07-11 NOTE — Assessment & Plan Note (Signed)
upper back pain consistent with thoracic/trapezius strain, spasms.  She's getting numbness into entire extremities which could indicate spinal stenosis.  Radiographs reassuring of cervical and thoracic spines.  Start with conservative treatment.  Prednisone dose pack and transition to diclofenac.  Start physical therapy, home exercises.  Heat for spasms.  Discussed ergonomic issues.  F/u in 6 weeks.

## 2014-08-22 ENCOUNTER — Ambulatory Visit: Payer: BLUE CROSS/BLUE SHIELD | Admitting: Family Medicine

## 2015-10-01 ENCOUNTER — Ambulatory Visit (INDEPENDENT_AMBULATORY_CARE_PROVIDER_SITE_OTHER): Payer: BLUE CROSS/BLUE SHIELD | Admitting: Family Medicine

## 2015-10-01 VITALS — BP 132/78 | HR 61 | Temp 98.0°F | Resp 16 | Ht 58.27 in | Wt 146.0 lb

## 2015-10-01 DIAGNOSIS — Z13220 Encounter for screening for lipoid disorders: Secondary | ICD-10-CM

## 2015-10-01 DIAGNOSIS — I10 Essential (primary) hypertension: Secondary | ICD-10-CM

## 2015-10-01 DIAGNOSIS — Z131 Encounter for screening for diabetes mellitus: Secondary | ICD-10-CM

## 2015-10-01 MED ORDER — LISINOPRIL-HYDROCHLOROTHIAZIDE 10-12.5 MG PO TABS: 1.0000 | ORAL_TABLET | Freq: Every day | ORAL | 0 refills | Status: DC

## 2015-10-01 NOTE — Assessment & Plan Note (Signed)
Will get BMP, UA, continue current regimen.  Reports only problems when she does not take the blue pill.  Follow up 3 months.

## 2015-10-01 NOTE — Progress Notes (Signed)
Subjective:    Patient ID: [name], female    DOB: 11/28/1970, 45 y.o.   MRN: [id number]  HPI Presents for hypertension follow-up. She was seen another physician but wanted to establish care here over. She is not having any side effects from the medication but only when she is taking the blue pills.  She states that she does not have chest pain, shortness of breath, blurry vision, palpitations, pedal edema.  She does get chronic headaches but states that they are not worse than usual.  She speaks [place] and her adult daughter translates for her.  She denies polyuria, polydipsia, polyphagia, nausea, vomiting, abdominal pain.  Has not been checked for diabetes and hyperlipidemia in a while per daughter.     Patient Active Problem List   Diagnosis Date Noted  . Upper back pain 07/11/2014  . Essential hypertension 03/26/2010  . HEADACHE, CHRONIC 03/26/2010   Past Medical History:  Diagnosis Date  . Hypertension    Past Surgical History:  Procedure Laterality Date  . CESAREAN SECTION    . TUBAL LIGATION     No Known Allergies Prior to Admission medications   Medication Sig Start Date End Date Taking? Authorizing Provider  diclofenac (VOLTAREN) 75 MG EC tablet Take 1 tablet (75 mg total) by mouth 2 (two) times daily. Start day AFTER finishing prednisone Patient not taking: Reported on 10/01/2015 07/10/14   Lenda KelpShane R Hudnall, MD  lisinopril-hydrochlorothiazide (PRINZIDE,ZESTORETIC) 10-12.5 MG tablet Take 1 tablet by mouth daily. Prefers blue tablets 10/01/15   Maria Branden B Daishon Chui, DO  naproxen sodium (ANAPROX) 220 MG tablet Take 220 mg by mouth 2 (two) times daily as needed (for pain).    Historical Provider, MD  oxyCODONE-acetaminophen (PERCOCET/ROXICET) 5-325 MG per tablet Take 1 tablet by mouth every 4 (four) hours as needed for severe pain. Patient not taking: Reported on 10/01/2015 05/01/13   Margarita Grizzleanielle Ray, MD  predniSONE (DELTASONE) 10 MG tablet 6 tabs po day 1, 5 tabs po day 2, 4 tabs  po day 3, 3 tabs po day 4, 2 tabs po day 5, 1 tab po day 6 Patient not taking: Reported on 10/01/2015 07/10/14   Lenda KelpShane R Hudnall, MD   Social History   Social History  . Marital status: Married    Spouse name: N/A  . Number of children: N/A  . Years of education: N/A   Occupational History  . Not on file.   Social History Main Topics  . Smoking status: Never Smoker  . Smokeless tobacco: Not on file  . Alcohol use No  . Drug use: Unknown  . Sexual activity: Not on file   Other Topics Concern  . Not on file   Social History Narrative  . No narrative on file     Review of Systems  Constitutional: Negative for chills, fatigue and fever.  HENT: Negative for congestion and rhinorrhea.   Respiratory: Negative for cough, chest tightness and shortness of breath.   Cardiovascular: Negative for chest pain and leg swelling.  Gastrointestinal: Negative for abdominal pain, nausea and vomiting.  Genitourinary: Negative for dysuria and urgency.  Skin: Negative for rash and wound.  Psychiatric/Behavioral: Negative for agitation and confusion.  All other systems reviewed and are negative.      Objective:   Physical Exam  Constitutional: She is oriented to person, place, and time. She appears well-developed and well-nourished. No distress.  HENT:  Head: Normocephalic and atraumatic.  Right Ear: External ear normal.  Left Ear: External ear  normal.  Neck: Normal range of motion. Neck supple.  Cardiovascular: Normal rate, regular rhythm, normal heart sounds and intact distal pulses.  Exam reveals no gallop and no friction rub.   No murmur heard. Pulmonary/Chest: Effort normal and breath sounds normal. No respiratory distress. She has no wheezes. She has no rales. She exhibits no tenderness.  Musculoskeletal: Normal range of motion. She exhibits no edema.  Neurological: She is alert and oriented to person, place, and time.  Skin: Skin is warm. No rash noted. She is not diaphoretic. No  erythema.  Psychiatric: She has a normal mood and affect. Her behavior is normal. Judgment and thought content normal.  Nursing note and vitals reviewed.   BP 132/78 (BP Location: Right Arm, Patient Position: Sitting, Cuff Size: Normal)   Pulse 61   Temp 98 F (36.7 C)   Resp 16   Ht 4' 10.27" (1.48 m)   Wt 146 lb (66.2 kg)   SpO2 98%   BMI 30.23 kg/m        Assessment & Plan:   Essential hypertension Will get BMP, UA, continue current regimen.  Reports only problems when she does not take the blue pill.  Follow up 3 months.  Signed,  Corliss MarcusAlicia Barnes, DO [facility] 5:18 PM 10/01/15

## 2015-10-01 NOTE — Patient Instructions (Addendum)
  Follow up 3 months, your medication has been sent to the pharmacy.  Will call if results are abnormal.     IF you received an x-ray today, you will receive an invoice from [facility]. Please contact [facility] at [phone] with questions or concerns regarding your invoice.   IF you received labwork today, you will receive an invoice from [facility]. Please contact [facility] at [phone] with questions or concerns regarding your invoice.   Our billing staff will not be able to assist you with questions regarding bills from these companies.  You will be contacted with the lab results as soon as they are available. The fastest way to get your results is to activate your My Chart account. Instructions are located on the last page of this paperwork. If you have not heard from us regarding the results in 2 weeks, please contact this office.

## 2015-10-02 LAB — BASIC METABOLIC PANEL WITH GFR
BUN: 11 mg/dL (ref 7–25)
CO2: 24 mmol/L (ref 20–31)
Calcium: 8.8 mg/dL (ref 8.6–10.2)
Chloride: 105 mmol/L (ref 98–110)
Creat: 0.69 mg/dL (ref 0.50–1.10)
Glucose, Bld: 80 mg/dL (ref 65–99)
Potassium: 4 mmol/L (ref 3.5–5.3)
Sodium: 138 mmol/L (ref 135–146)

## 2015-10-02 LAB — LIPID PANEL
CHOL/HDL RATIO: 3 ratio (ref <=5.0)
Cholesterol: 172 mg/dL (ref 125–200)
HDL: 57 mg/dL (ref >=46)
LDL CALC: 92 mg/dL (ref <130)
TRIGLYCERIDES: 117 mg/dL (ref <150)
VLDL: 23 mg/dL (ref <30)

## 2015-10-02 LAB — HEMOGLOBIN A1C
Hgb A1c MFr Bld: 5.5 % (ref <5.7)
Mean Plasma Glucose: 111 mg/dL

## 2015-10-02 NOTE — Progress Notes (Signed)
Patient discussed with Dr. Zachery DauerBarnes. Agree with assessment and plan of care per her note.   Signed,   Meredith StaggersJeffrey Haidy Kackley, MD [facility].  10/02/15 11:58 PM

## 2016-04-28 ENCOUNTER — Ambulatory Visit (INDEPENDENT_AMBULATORY_CARE_PROVIDER_SITE_OTHER): Payer: Self-pay | Admitting: Family Medicine

## 2016-04-28 VITALS — BP 124/66 | HR 84 | Temp 98.0°F | Resp 16 | Ht 58.27 in | Wt 147.0 lb

## 2016-04-28 DIAGNOSIS — J029 Acute pharyngitis, unspecified: Secondary | ICD-10-CM

## 2016-04-28 DIAGNOSIS — J020 Streptococcal pharyngitis: Secondary | ICD-10-CM

## 2016-04-28 LAB — POCT RAPID STREP A (OFFICE): Rapid Strep A Screen: POSITIVE — AB

## 2016-04-28 MED ORDER — AMOXICILLIN 500 MG PO CAPS: 500.0000 mg | ORAL_CAPSULE | Freq: Three times a day (TID) | ORAL | 0 refills | Status: DC

## 2016-04-28 NOTE — Progress Notes (Signed)
n

## 2016-04-28 NOTE — Progress Notes (Signed)
Subjective:  By signing my name below, I, Moises Blood, attest that this documentation has been prepared under the direction and in the presence of Merri Ray, MD. Electronically Signed: Moises Blood, Jessie. 04/28/2016 , 3:27 PM .  Patient was seen in Room 8 .   Patient ID: [name], female    DOB: 1970-12-07, 46 y.o.   MRN: [id number] Chief Complaint  Patient presents with   Sore Throat   Headache   Fatigue   HPI [name] is a 46 y.o. female  Patient's primary language is [place] and understands little [place]. Her daughter translated for the patient in the room.   Patient reports not feeling well with sore throat, headache, hot & cold spells and body aches all over starting yesterday. She also notes diffuse aches in the back of her neck. She denies any known sick contact. She denies photophobia or phonophobia. She denies taking any medications for her illness.   Patient Active Problem List   Diagnosis Date Noted   Upper back pain 07/11/2014   Essential hypertension 03/26/2010   HEADACHE, CHRONIC 03/26/2010   Past Medical History:  Diagnosis Date   Hypertension    Past Surgical History:  Procedure Laterality Date   CESAREAN SECTION     TUBAL LIGATION     No Known Allergies Prior to Admission medications   Medication Sig Start Date End Date Taking? Authorizing Provider  lisinopril-hydrochlorothiazide (PRINZIDE,ZESTORETIC) 10-12.5 MG tablet Take 1 tablet by mouth daily. Prefers blue tablets Patient not taking: Reported on 7/85/8850 2/77/41   Elmo Putt B Chitanand, DO  naproxen sodium (ANAPROX) 220 MG tablet Take 220 mg by mouth 2 (two) times daily as needed (for pain).    Historical Provider, MD  oxyCODONE-acetaminophen (PERCOCET/ROXICET) 5-325 MG per tablet Take 1 tablet by mouth every 4 (four) hours as needed for severe pain. Patient not taking: Reported on 10/01/2015 05/01/13   Pattricia Boss, MD   Social History   Social History   Marital status:  Married    Spouse name: N/A   Number of children: N/A   Years of education: N/A   Occupational History   Not on file.   Social History Main Topics   Smoking status: Never Smoker   Smokeless tobacco: Never Used   Alcohol use No   Drug use: Unknown   Sexual activity: Not on file   Other Topics Concern   Not on file   Social History Narrative   No narrative on file   Review of Systems  Constitutional: Positive for chills and fatigue. Negative for unexpected weight change.  HENT: Positive for sore throat. Negative for congestion.   Eyes: Negative for photophobia.  Gastrointestinal: Negative for constipation, diarrhea, nausea and vomiting.  Skin: Negative for rash and wound.  Neurological: Positive for headaches. Negative for dizziness and weakness.       Objective:   Physical Exam  Constitutional: She is oriented to person, place, and time. She appears well-developed and well-nourished. No distress.  HENT:  Head: Normocephalic and atraumatic.  Right Ear: Hearing, tympanic membrane, external ear and ear canal normal.  Left Ear: Hearing, tympanic membrane, external ear and ear canal normal.  Nose: Nose normal.  Mouth/Throat: Posterior oropharyngeal erythema present. No oropharyngeal exudate.  Minimal cobblestoning in posterior oropharynx.   Eyes: Conjunctivae and EOM are normal. Pupils are equal, round, and reactive to light.  Cardiovascular: Normal rate, regular rhythm, normal heart sounds and intact distal pulses.   No murmur heard. Pulmonary/Chest: Effort normal and  breath sounds normal. No respiratory distress. She has no wheezes. She has no rhonchi.  Lymphadenopathy:  Tender enlarged AC nodes, left greater than right  Neurological: She is alert and oriented to person, place, and time. She has normal strength.  Strength intact in lower extremities  Skin: Skin is warm and dry. No rash noted.  Psychiatric: She has a normal mood and affect. Her behavior is  normal.  Vitals reviewed.   Vitals:   04/28/16 1431  BP: 124/66  Pulse: 84  Resp: 16  Temp: 98 F (36.7 C)  TempSrc: Oral  SpO2: 99%  Weight: 147 lb (66.7 kg)  Height: 4' 10.27" (1.48 m)   Results for orders placed or performed in visit on 04/28/16  POCT rapid strep A  Result Value Ref Range   Rapid Strep A Screen Positive (A) Negative      Assessment & Plan:    [name] is a 46 y.o. female Sore throat - Plan: POCT rapid strep A, amoxicillin (AMOXIL) 500 MG capsule  Strep pharyngitis - Plan: amoxicillin (AMOXIL) 500 MG capsule strep pharyngitis, reassuring exam and vitals, no sign of peritonsillar abscess.   - amoxicillin 545m tid, antipyretics otc, symptomatic care discussed and RTC precautions.  - Daughter translated, offered vMarketing executivebut declined. Understanding expressed.   Meds ordered this encounter  Medications   amoxicillin (AMOXIL) 500 MG capsule    Sig: Take 1 capsule (500 mg total) by mouth 3 (three) times daily.    Dispense:  30 capsule    Refill:  0   Patient Instructions    Start antibiotic for strep throat. For pain and throat, can try Cepacol or other sore throat lozenges, as well as a pain reliever such as Tylenol, Advil, or Aleve over-the-counter. Warm or cold fluids - whichever feels better, but make sure to drink plenty of fluids. Return to the clinic or go to the nearest emergency room if any of your symptoms worsen or new symptoms occur.    Vim h?ng do lin c?u khu?n (Strep Throat) Vim h?ng do lin c?u khu?n l m?t nhi?m trng ? h?ng. Chuyn gia ch?m [place] s?c kh?e c th? g?i l vim ami?an nhi?m trng ho?c vim h?ng ty thu?c vo vi?c c b? s?ng ? ami?an ho?c s?ng ? thnh sau c?a h?ng hay khng. Vim h?ng do lin c?u khu?n ph? bi?n nh?t vo nh?ng thng l?nh trong n?m ? tr? em t? 5-15 tu?i, nh?ng n c th? x?y ra ? b?t k? ma no v ? b?t k? ai. B?nh ny ly t? ng??i sang ng??i (d? ly) thng qua ho, h?t h?i ho?c ti?p xc g?n  g?i. NGUYN NHN Vim h?ng do lin c?u khu?n do m?t vi khu?n g?i l Streptococcus pyogenes gy ra. CC Y?U T? NGUY C? Tnh tr?ng ny hay x?y ra h?n ?:  Nh?ng ng??i dnh th?i gian ? nh?ng ch? ?ng ng??i, n?i m nhi?m trng c th? d? dng ly lan.  Nh?ng ng??i c ti?p xc g?n g?i v?i ng??i b? vim h?ng do lin c?u khu?n. TRI?U CH?NG Nh?ng tri?u ch?ng c?a tnh tr?ng ny bao g?m:  S?t ho?c ?n l?nh.  T?y ??, s?ng ho?c ?au ? ami?an ho?c h?ng.  ?au khi nu?t ho?c kh nu?t.  C nh?ng ch?m mu tr?ng ho?c mu vng ? ami?an ho?c ? h?ng.  H?ch ? c? ho?c d??i hm s?ng v nh?y c?m ?au.  Pht ban ?? kh?p c? th? (hi?m g?p). CH?N ?ON Tnh tr?ng ny ???c ch?n ?on  b?ng cch xt nghi?m lin c?u khu?n nhanh ho?c b?ng cch dng m?t que c?y l?y d?ch ? h?ng (xt nghi?m c?y vi khu?n ? h?ng). K?t qu? xt nghi?m lin c?u khu?n nhanh th??ng c trong vi pht, nh?ng k?t qu? xt nghi?m c?y vi khu?n ? h?ng c sau m?t ho?c hai ngy. ?I?U TR? Tnh tr?ng ny ???c ?i?u tr? b?ng thu?c khng sinh. H??NG D?N CH?M [place] T?I NH Thu?c  Ch? s? d?ng thu?c khng c?n k ??n v thu?c c?n k ??n theo ch? d?n c?a chuyn gia ch?m [place] s?c kh?e.  S? d?ng khng sinh theo ch? d?n c?a chuyn gia ch?m [place] s?c kh?e. Khng d?ng u?ng thu?c khng sinh ngay c? khi qu v? b?t ??u c?m th?y ?? h?n.  Cho ng??i trong gia ?nh qu v? c?ng b? vim h?ng ho?c s?t ?i ki?m tra xem c b? vim h?ng do lin c?u khu?n khng. H? c th? c?n thu?c khng sinh n?u h? b? nhi?m lin c?u khu?n. ?n v u?ng  Khngdng chung th?c ?n, c?c u?ng n??c ho?c cc v?t d?ng c nhn c th? ly nhi?m b?nh cho ng??i khc.  N?u kh nu?t, hy th? ?n th?c ?n m?m cho ??n khi h?ng qu v? c?m th?y ?? h?n.  U?ng ?? n??c ?? gi? cho n??c ti?u trong ho?c c mu vng nh?t. H??ng d?n chung  Xc mi?ng b?ng h?n h?p n??c mu?i 3-4 l?n m?i ngy ho?c khi c?n. ?? lm m?t h?n h?p n??c mu?i, hy ha tan hon ton -1 tha c ph mu?i trong m?t c?c n??c ?m.  B?o ??m r?ng t?t c? m?i ng??i  trong gia ?nh ph?i r?a tay k?.  Ngh? ng?i th?t nhi?u.  Ngh? h?c ho?c ngh? lm v ? nh cho ??n khi qu v? ? s? d?ng thu?c khng sinh trong 24 gi?Athena Masse th? t?t c? cc cu?c h?n khm l?i theo ch? d?n c?a chuyn gia ch?m [place] s?c kh?e. ?i?u ny c vai tr quan tr?ng. ?I KHM N?U:  Cc h?ch ? c? c?a qu v? ti?p t?c to ln.  Qu v? b? pht ban, ho, ho?c ?au tai.  Qu v? ho ra m?t ch?t d?ch ??c mu xanh l cy, nu vng, ho?c l?n mu.  Qu v? b? ?au ho?c kh ch?u m khng ?? h?n sau khi dng thu?c.  V?n ?? c?a qu v? c v? t?i t? h?n ch? khng ?? h?n.  Qu v? b? s?t. NGAY L?P T?C ?I KHM N?U:  Qu v? c b?t k? tri?u ch?ng m?i no, ch?ng h?n nh? nn m?a, ?au ??u d? d?i, c?ng ho?c ?au c?, ?au ng?c ho?c kh th?.  Qu v? b? ?au h?ng r?t nhi?u, ch?y n??c m?i, ho?c thay ??i gi?ng.  Qu v? b? s?ng ? c?, ho?c da trn c? b? ?? v nh?y c?m ?au.  Qu v? c cc d?u hi?u m?t n??c, ch?ng h?n nh? m?t m?i, kh mi?ng v gi?m s? l??ng n??c ti?u.  Qu v? ngy cng tr? nn bu?n ng? ho?c khng th? t?nh hon ton.  Cc kh?p c?a qu v? b? ?? v ?au. Thng tin ny khng nh?m m?c ?ch thay th? cho l?i khuyn m chuyn gia ch?m [place] s?c kh?e ni v?i qu v?. Hy b?o ??m qu v? ph?i th?o lu?n b?t k? v?n ?? g m qu v? c v?i chuyn gia ch?m [place] s?c kh?e c?a qu v?. Document Released: 02/02/2005 Document Revised: 10/24/2014 Document Reviewed: 05/28/2014 Elsevier Interactive Patient Education  2017 Elsevier  Inc.    IF you received an x-ray today, you will receive an invoice from [facility]. Please contact [facility] at [phone] with questions or concerns regarding your invoice.   IF you received labwork today, you will receive an invoice from [place] [facility]. Please contact [facility] at [phone] with questions or concerns regarding your invoice.   Our billing staff will not be able to assist you with questions regarding bills from these companies.  You will be contacted with the lab  results as soon as they are available. The fastest way to get your results is to activate your My Chart account. Instructions are located on the last page of this paperwork. If you have not heard from [place] regarding the results in 2 weeks, please contact this office.      I personally performed the services described in this documentation, which was scribed in my presence. The recorded information has been reviewed and considered for accuracy and completeness, addended by me as needed, and agree with information above.  Signed,   Merri Ray, MD [facility] [place].  04/28/16 3:32 PM

## 2016-04-28 NOTE — Patient Instructions (Addendum)
Start antibiotic for strep throat. For pain and throat, can try Cepacol or other sore throat lozenges, as well as a pain reliever such as Tylenol, Advil, or Aleve over-the-counter. Warm or cold fluids - whichever feels better, but make sure to drink plenty of fluids. Return to the clinic or go to the nearest emergency room if any of your symptoms worsen or new symptoms occur.    Vim h?ng do lin c?u khu?n (Strep Throat) Vim h?ng do lin c?u khu?n l m?t nhi?m trng ? h?ng. Chuyn gia ch?m [place] s?c kh?e c th? g?i l vim ami?an nhi?m trng ho?c vim h?ng ty thu?c vo vi?c c b? s?ng ? ami?an ho?c s?ng ? thnh sau c?a h?ng hay khng. Vim h?ng do lin c?u khu?n ph? bi?n nh?t vo nh?ng thng l?nh trong n?m ? tr? em t? 5-15 tu?i, nh?ng n c th? x?y ra ? b?t k? ma no v ? b?t k? ai. B?nh ny ly t? ng??i sang ng??i (d? ly) thng qua ho, h?t h?i ho?c ti?p xc g?n g?i. NGUYN NHN Vim h?ng do lin c?u khu?n do m?t vi khu?n g?i l Streptococcus pyogenes gy ra. CC Y?U T? NGUY C? Tnh tr?ng ny hay x?y ra h?n ?:  Nh?ng ng??i dnh th?i gian ? nh?ng ch? ?ng ng??i, n?i m nhi?m trng c th? d? dng ly lan.  Nh?ng ng??i c ti?p xc g?n g?i v?i ng??i b? vim h?ng do lin c?u khu?n. TRI?U CH?NG Nh?ng tri?u ch?ng c?a tnh tr?ng ny bao g?m:  S?t ho?c ?n l?nh.  T?y ??, s?ng ho?c ?au ? ami?an ho?c h?ng.  ?au khi nu?t ho?c kh nu?t.  C nh?ng ch?m mu tr?ng ho?c mu vng ? ami?an ho?c ? h?ng.  H?ch ? c? ho?c d??i hm s?ng v nh?y c?m ?au.  Pht ban ?? kh?p c? th? (hi?m g?p). CH?N ?ON Tnh tr?ng ny ???c ch?n ?on b?ng cch xt nghi?m lin c?u khu?n nhanh ho?c b?ng cch dng m?t que c?y l?y d?ch ? h?ng (xt nghi?m c?y vi khu?n ? h?ng). K?t qu? xt nghi?m lin c?u khu?n nhanh th??ng c trong vi pht, nh?ng k?t qu? xt nghi?m c?y vi khu?n ? h?ng c sau m?t ho?c hai ngy. ?I?U TR? Tnh tr?ng ny ???c ?i?u tr? b?ng thu?c khng sinh. H??NG D?N CH?M [place] T?I NH Thu?c  Ch? s? d?ng thu?c  khng c?n k ??n v thu?c c?n k ??n theo ch? d?n c?a chuyn gia ch?m [place] s?c kh?e.  S? d?ng khng sinh theo ch? d?n c?a chuyn gia ch?m [place] s?c kh?e. Khng d?ng u?ng thu?c khng sinh ngay c? khi qu v? b?t ??u c?m th?y ?? h?n.  Cho ng??i trong gia ?nh qu v? c?ng b? vim h?ng ho?c s?t ?i ki?m tra xem c b? vim h?ng do lin c?u khu?n khng. H? c th? c?n thu?c khng sinh n?u h? b? nhi?m lin c?u khu?n. ?n v u?ng  Khngdng chung th?c ?n, c?c u?ng n??c ho?c cc v?t d?ng c nhn c th? ly nhi?m b?nh cho ng??i khc.  N?u kh nu?t, hy th? ?n th?c ?n m?m cho ??n khi h?ng qu v? c?m th?y ?? h?n.  U?ng ?? n??c ?? gi? cho n??c ti?u trong ho?c c mu vng nh?t. H??ng d?n chung  Xc mi?ng b?ng h?n h?p n??c mu?i 3-4 l?n m?i ngy ho?c khi c?n. ?? lm m?t h?n h?p n??c mu?i, hy ha tan hon ton -1 tha c ph mu?i trong m?t c?c n??c ?m.  B?o ??  m r?ng t?t c? m?i ng??i trong gia ?nh ph?i r?a tay k?.  Ngh? ng?i th?t nhi?u.  Ngh? h?c ho?c ngh? lm v ? nh cho ??n khi qu v? ? s? d?ng thu?c khng sinh trong 24 gi?Athena Masse th? t?t c? cc cu?c h?n khm l?i theo ch? d?n c?a chuyn gia ch?m [place] s?c kh?e. ?i?u ny c vai tr quan tr?ng. ?I KHM N?U:  Cc h?ch ? c? c?a qu v? ti?p t?c to ln.  Qu v? b? pht ban, ho, ho?c ?au tai.  Qu v? ho ra m?t ch?t d?ch ??c mu xanh l cy, nu vng, ho?c l?n mu.  Qu v? b? ?au ho?c kh ch?u m khng ?? h?n sau khi dng thu?c.  V?n ?? c?a qu v? c v? t?i t? h?n ch? khng ?? h?n.  Qu v? b? s?t. NGAY L?P T?C ?I KHM N?U:  Qu v? c b?t k? tri?u ch?ng m?i no, ch?ng h?n nh? nn m?a, ?au ??u d? d?i, c?ng ho?c ?au c?, ?au ng?c ho?c kh th?.  Qu v? b? ?au h?ng r?t nhi?u, ch?y n??c m?i, ho?c thay ??i gi?ng.  Qu v? b? s?ng ? c?, ho?c da trn c? b? ?? v nh?y c?m ?au.  Qu v? c cc d?u hi?u m?t n??c, ch?ng h?n nh? m?t m?i, kh mi?ng v gi?m s? l??ng n??c ti?u.  Qu v? ngy cng tr? nn bu?n ng? ho?c khng th? t?nh hon ton.  Cc kh?p c?a qu v? b?  ?? v ?au. Thng tin ny khng nh?m m?c ?ch thay th? cho l?i khuyn m chuyn gia ch?m [place] s?c kh?e ni v?i qu v?. Hy b?o ??m qu v? ph?i th?o lu?n b?t k? v?n ?? g m qu v? c v?i chuyn gia ch?m [place] s?c kh?e c?a qu v?. Document Released: 02/02/2005 Document Revised: 10/24/2014 Document Reviewed: 05/28/2014 Elsevier Interactive Patient Education  2017 [place].    IF you received an x-ray today, you will receive an invoice from [facility]. Please contact [facility] at [phone] with questions or concerns regarding your invoice.   IF you received labwork today, you will receive an invoice from [facility]. Please contact [facility] at [phone] with questions or concerns regarding your invoice.   Our billing staff will not be able to assist you with questions regarding bills from these companies.  You will be contacted with the lab results as soon as they are available. The fastest way to get your results is to activate your My Chart account. Instructions are located on the last page of this paperwork. If you have not heard from [place] regarding the results in 2 weeks, please contact this office.

## 2016-08-19 ENCOUNTER — Encounter (HOSPITAL_COMMUNITY): Payer: Self-pay | Admitting: Emergency Medicine

## 2016-08-19 ENCOUNTER — Emergency Department (HOSPITAL_COMMUNITY)
Admission: EM | Admit: 2016-08-19 | Discharge: 2016-08-19 | Disposition: A | Discharge status: Home / Self Care | Payer: BLUE CROSS/BLUE SHIELD | Attending: Emergency Medicine | Admitting: Emergency Medicine

## 2016-08-19 DIAGNOSIS — Z23 Encounter for immunization: Secondary | ICD-10-CM | POA: Insufficient documentation

## 2016-08-19 DIAGNOSIS — I10 Essential (primary) hypertension: Secondary | ICD-10-CM | POA: Insufficient documentation

## 2016-08-19 DIAGNOSIS — Y929 Unspecified place or not applicable: Secondary | ICD-10-CM | POA: Insufficient documentation

## 2016-08-19 DIAGNOSIS — S91311A Laceration without foreign body, right foot, initial encounter: Secondary | ICD-10-CM | POA: Insufficient documentation

## 2016-08-19 DIAGNOSIS — Y939 Activity, unspecified: Secondary | ICD-10-CM | POA: Insufficient documentation

## 2016-08-19 DIAGNOSIS — Z79899 Other long term (current) drug therapy: Secondary | ICD-10-CM | POA: Insufficient documentation

## 2016-08-19 DIAGNOSIS — Y999 Unspecified external cause status: Secondary | ICD-10-CM | POA: Insufficient documentation

## 2016-08-19 DIAGNOSIS — W260XXA Contact with knife, initial encounter: Secondary | ICD-10-CM | POA: Insufficient documentation

## 2016-08-19 MED ORDER — DOXYCYCLINE HYCLATE 100 MG PO CAPS: 100.0000 mg | ORAL_CAPSULE | Freq: Two times a day (BID) | ORAL | 0 refills | Status: DC

## 2016-08-19 MED ORDER — BACITRACIN ZINC 500 UNIT/GM EX OINT
TOPICAL_OINTMENT | Freq: Once | CUTANEOUS | Status: AC
Start: 2016-08-19 — End: 2016-08-19
  Administered 2016-08-19: 12:00:00 via TOPICAL
  Filled 2016-08-19: qty 0.9

## 2016-08-19 MED ORDER — LISINOPRIL-HYDROCHLOROTHIAZIDE 10-12.5 MG PO TABS: 1.0000 | ORAL_TABLET | Freq: Every day | ORAL | 0 refills | Status: DC

## 2016-08-19 MED ORDER — TETANUS-DIPHTH-ACELL PERTUSSIS 5-2.5-18.5 LF-MCG/0.5 IM SUSP
0.5000 mL | Freq: Once | INTRAMUSCULAR | Status: AC
  Administered 2016-08-19: 0.5 mL via INTRAMUSCULAR
  Filled 2016-08-19: qty 0.5

## 2016-08-19 NOTE — ED Provider Notes (Signed)
[facility]-EMERGENCY DEPT Provider Note   CSN: [id number] Arrival date & time: 08/19/16  1026  By signing my name below, I, Freida Busman, attest that this documentation has been prepared under the direction and in the presence of [address] [employer]. Electronically Signed: Freida Busman, Scribe. 08/19/2016. 11:37 AM.  History   Chief Complaint Chief Complaint  Patient presents with  . Extremity Laceration    right foot    The history is provided by the patient, medical records and a relative. A language interpreter was used.  Laceration   The incident occurred yesterday. The laceration is located on the right foot. Size: <1cm. The laceration mechanism was a a dirty knife (knife that she was using to cut meat with). The pain is at a severity of 6/10. The pain is mild. The pain has been constant since onset. It is unknown if a foreign body is present. Her tetanus status is out of date.     HPI Comments: [name] is a 46 y.o. female with a PMHx of HTN, who presents to the Emergency Department complaining of a laceration to the right foot. Pt accidentally dropped a knife on the foot at ~2100 last night. She was cutting meat with the knife, but the knife was otherwise clean except for the meat juices. She states she didn't come in until today because the pain worsened this morning. Pt describes her pain as 6-7/10 constant throbbing nonradiating R foot laceration-area pain. She states her pain is exacerbated when ambulating. She has not tried anything for her pain, and has only cleaned the wound but done nothing else for it. Pt notes mild associated surrounding swelling, increased warmth, and redness. No drainage. She also notes tingling in the right great toe. Wound cleaned PTA; bleeding controlled with pressure. Tetanus status is unknown; she possibly received one 10 years ago.  Pt is not a native [place] speaker; history translated by family at bedside.   Pt has a h/o HTN and has not been compliant  with her Lisinopril-HCTZ; last taken a few months ago. Pt ran out, does not have a PCP to get refills from; last went to [facility] urgent care for refill >72yr ago. She denies fevers, chills, CP, SOB, abd pain, N/V/D/C, hematuria, dysuria, arthralgias, numbness, focal weakness, vision changes, HA, or any other complaints at this time. NKDA.     Past Medical History:  Diagnosis Date  . Hypertension     Patient Active Problem List   Diagnosis Date Noted  . Upper back pain 07/11/2014  . Essential hypertension 03/26/2010  . HEADACHE, CHRONIC 03/26/2010    Past Surgical History:  Procedure Laterality Date  . CESAREAN SECTION    . TUBAL LIGATION      OB History    Gravida Para Term Preterm AB Living   4 4 4  0 0 3   SAB TAB Ectopic Multiple Live Births   0 0 0 0         Home Medications    Prior to Admission medications   Medication Sig Start Date End Date Taking? Authorizing Provider  amoxicillin (AMOXIL) 500 MG capsule Take 1 capsule (500 mg total) by mouth 3 (three) times daily. 04/28/16   Shade Flood, MD  lisinopril-hydrochlorothiazide (PRINZIDE,ZESTORETIC) 10-12.5 MG tablet Take 1 tablet by mouth daily. Prefers blue tablets Patient not taking: Reported on 04/28/2016 10/01/15   Chitanand, Helmut Muster B, DO  naproxen sodium (ANAPROX) 220 MG tablet Take 220 mg by mouth 2 (two) times daily as needed (  for pain).    [provider]    Family History History reviewed. No pertinent family history.  Social History Social History  Substance Use Topics  . Smoking status: Never Smoker  . Smokeless tobacco: Never Used  . Alcohol use No     Allergies   Patient has no known allergies.   Review of Systems Review of Systems  Constitutional: Negative for chills and fever.  Eyes: Negative for visual disturbance.  Respiratory: Negative for shortness of breath.   Cardiovascular: Negative for chest pain.  Gastrointestinal: Negative for abdominal pain, constipation, diarrhea,  nausea and vomiting.  Genitourinary: Negative for dysuria and hematuria.  Musculoskeletal: Positive for joint swelling and myalgias (R foot wound). Negative for arthralgias.  Skin: Positive for color change and wound.  Allergic/Immunologic: Negative for immunocompromised state.  Neurological: Negative for weakness, numbness and headaches.       +Tingling  Psychiatric/Behavioral: Negative for confusion.    All systems reviewed and are negative for acute change except as noted in the HPI.   Physical Exam Updated Vital Signs BP (!) 171/98   Pulse (!) 56   Temp 98.6 F (37 C) (Oral)   Resp 18   LMP 08/03/2016   SpO2 99%   Physical Exam  Constitutional: She is oriented to person, place, and time. Vital signs are normal. She appears well-developed and well-nourished.  Non-toxic appearance. No distress.  Afebrile, nontoxic, NAD Mild HTN noted which is similar to prior ED visit  HENT:  Head: Normocephalic and atraumatic.  Mouth/Throat: Mucous membranes are normal.  Eyes: Conjunctivae and EOM are normal. Right eye exhibits no discharge. Left eye exhibits no discharge.  Neck: Normal range of motion. Neck supple.  Cardiovascular: Normal rate and intact distal pulses.   Pulmonary/Chest: Effort normal. No respiratory distress.  Abdominal: Normal appearance. She exhibits no distension.  Musculoskeletal: Normal range of motion.  See skin exam   Neurological: She is alert and oriented to person, place, and time. She has normal strength. No sensory deficit.  Skin: Skin is warm and dry. Laceration noted. No rash noted.  Right foot with ~157mm linear laceration to the dorsal aspect just proximal to the first MTP joint, fairly superficial with wound bed fully visualized and no retained FBs noted, no ongoing bleeding, no evidence of infection. SEE PICTURE BELOW. No focal bony TTP with very mild TTP over wound, FROM intact in all toes, no swelling or crepitus, no deformity, Strength and sensation  grossly intact, distal pulses intact, compartments soft.  Psychiatric: She has a normal mood and affect. Her behavior is normal.  Nursing note and vitals reviewed.      ED Treatments / Results  DIAGNOSTIC STUDIES:  Oxygen Saturation is 99% on RA, normal by my interpretation.    COORDINATION OF CARE:  11:35 AM Discussed treatment plan with pt and family at bedside and pt agreed to plan.  Labs (all labs ordered are listed, but only abnormal results are displayed) Labs Reviewed - No data to display  EKG  EKG Interpretation None       Radiology No results found.  Procedures Procedures (including critical care time)  Medications Ordered in ED Medications  Tdap (BOOSTRIX) injection 0.5 mL (not administered)  bacitracin ointment (not administered)     Initial Impression / Assessment and Plan / ED Course  I have reviewed the triage vital signs and the nursing notes.  Pertinent labs & imaging results that were available during my care of the patient were reviewed by  me and considered in my medical decision making (see chart for details).     46 y.o. female here with R foot lac sustained 14hrs ago; very superficial wound, bed of wound fully visualized and no retained FBs noted, no focal bony TTP. NVI with soft compartments, and able to move all digits without significant difficulty. Highly doubt underlying tendon/nerve/bony injury. Doubt need for xray imaging. Since it's been >12hrs, will not perform closure, will leave to close by secondary intent; this wound should heal just fine without intervention anyway, given how superficial and how small it is, and how well approximated the edges are already. Will start on abx to cover for infection, however no signs of infection at this time. Doubt need for xray imaging at this time. Will update Tdap. Advised proper wound care and f/up with [facility] in 3days for wound check; f/up with [facility] in 1wk for recheck and for ongoing medical care. Of  note, HTN noted, similar to prior visit; off BP meds for several months; pt asymptomatic at this time. Doubt need for further emergent work up however will provide one time courtesy refill of BP med now and encouraged proper use of this, as well as DASH diet. F/up with [facility] for ongoing refills and medical care. I explained the diagnosis and have given explicit precautions to return to the ER including for any other new or worsening symptoms. The patient understands and accepts the medical plan as it's been dictated and I have answered their questions. Discharge instructions concerning home care and prescriptions have been given. The patient is STABLE and is discharged to home in good condition.   I personally performed the services described in this documentation, which was scribed in my presence. The recorded information has been reviewed and is accurate.   Final Clinical Impressions(s) / ED Diagnoses   Final diagnoses:  Laceration of right foot, initial encounter  Essential hypertension    New Prescriptions New Prescriptions   DOXYCYCLINE (VIBRAMYCIN) 100 MG CAPSULE    Take 1 capsule (100 mg total) by mouth 2 (two) times daily. One po bid x 7 days   LISINOPRIL-HYDROCHLOROTHIAZIDE (ZESTORETIC) 10-12.5 MG TABLET    Take 1 tablet by mouth daily.     [address] [place], [place] 08/19/16 1158    Linwood Dibbles, MD 08/19/16 [phone]

## 2016-08-19 NOTE — Discharge Instructions (Signed)
Keep wound clean with mild soap and water, cleaning it at least twice daily. Keep area covered with a topical antibiotic ointment and bandage, keep bandage dry. Ice and elevate for additional pain and swelling relief. Alternate between Ibuprofen and Tylenol for additional pain relief. Take the antibiotic as directed until completed, to cover you for any infection. [facility] in approximately 3 days for wound recheck. Follow up with the [place] [facility] center in 1 week for recheck and to establish medical care.  Your blood pressure was elevated today, you must take your medication as directed; this medication was refilled today as a courtesy, but it's very important that you find a regular doctor to manage your ongoing medical care and future refills. Eat a low salt diet to help with your high blood pressure.  Monitor area for signs of infection to include, but not limited to: increasing pain, spreading redness, drainage/pus, worsening swelling, or fevers. Return to emergency department for emergent changing or worsening symptoms.

## 2016-08-19 NOTE — ED Triage Notes (Signed)
Pt reports right foot laceration, knife fell on top of R foot, bleeding controlled, wound cleaned and wrapped in triage room, also reports out of her BP meds, high BP reading today,

## 2016-09-11 ENCOUNTER — Telehealth: Payer: Self-pay | Admitting: General Practice

## 2016-09-11 NOTE — Telephone Encounter (Signed)
Pt's daughter is asking for 90 day supply for BP rx (blue tablets)

## 2016-09-14 ENCOUNTER — Ambulatory Visit (INDEPENDENT_AMBULATORY_CARE_PROVIDER_SITE_OTHER): Payer: Self-pay | Admitting: Family Medicine

## 2016-09-14 ENCOUNTER — Encounter: Payer: Self-pay | Admitting: Family Medicine

## 2016-09-14 VITALS — BP 134/76 | HR 54 | Temp 98.1°F | Resp 16 | Ht 58.5 in | Wt 147.0 lb

## 2016-09-14 DIAGNOSIS — R9431 Abnormal electrocardiogram [ECG] [EKG]: Secondary | ICD-10-CM

## 2016-09-14 DIAGNOSIS — M791 Myalgia, unspecified site: Secondary | ICD-10-CM

## 2016-09-14 DIAGNOSIS — R42 Dizziness and giddiness: Secondary | ICD-10-CM

## 2016-09-14 DIAGNOSIS — R001 Bradycardia, unspecified: Secondary | ICD-10-CM

## 2016-09-14 DIAGNOSIS — Z789 Other specified health status: Secondary | ICD-10-CM

## 2016-09-14 DIAGNOSIS — I10 Essential (primary) hypertension: Secondary | ICD-10-CM

## 2016-09-14 MED ORDER — LISINOPRIL-HYDROCHLOROTHIAZIDE 10-12.5 MG PO TABS: 1.0000 | ORAL_TABLET | Freq: Every day | ORAL | 2 refills | Status: DC

## 2016-09-14 NOTE — Patient Instructions (Addendum)
I will check thyroid test for current slow heart rate, but would like you to meet with cardiologist as there are some possible abnormalities on your EKG.  Heart rate is also slower past few months, compared to previous readings.  For now I will continue same dose of medication, but check your blood pressures 1-2 times per day this week and if any lower blood pressure readings less than (120 on top number), would likely need to decrease the dose of medicines.  I will refer you to cardiologist to discuss this and your abnormal EKG further.  Chest pain appears to be chest wall pain - see info below. If any worsening chest pain.   Return to the clinic or go to the nearest emergency room if any of your symptoms worsen or new symptoms occur.  Muscle aches in the upper back are likely due to type of work. Heat, gentle range of motion and stretches are okay for now, Tylenol is okay for now, please follow-up if the symptoms persist.   Chest Wall Pain Chest wall pain is pain in or around the bones and muscles of your chest. Sometimes, an injury causes this pain. Sometimes, the cause may not be known. This pain may take several weeks or longer to get better. Follow these instructions at home: Pay attention to any changes in your symptoms. Take these actions to help with your pain:  Rest as told by your health care provider.  Avoid activities that cause pain. These include any activities that use your chest muscles or your abdominal and side muscles to lift heavy items.  If directed, apply ice to the painful area: ? Put ice in a plastic bag. ? Place a towel between your skin and the bag. ? Leave the ice on for 20 minutes, 2-3 times per day.  Take over-the-counter and prescription medicines only as told by your health care provider.  Do not use tobacco products, including cigarettes, chewing tobacco, and e-cigarettes. If you need help quitting, ask your health care provider.  Keep all follow-up  visits as told by your health care provider. This is important.  Contact a health care provider if:  You have a fever.  Your chest pain becomes worse.  You have new symptoms. Get help right away if:  You have nausea or vomiting.  You feel sweaty or light-headed.  You have a cough with phlegm (sputum) or you cough up blood.  You develop shortness of breath. This information is not intended to replace advice given to you by your health care provider. Make sure you discuss any questions you have with your health care provider. Document Released: 02/02/2005 Document Revised: 06/13/2015 Document Reviewed: 04/30/2014 Elsevier Interactive Patient Education  2017 Elsevier Inc.   Bradycardia, Adult Bradycardia is a slower-than-normal heartbeat. A normal resting heart rate for an adult ranges from 60 to 100 beats per minute. With bradycardia, the resting heart rate is less than 60 beats per minute. Bradycardia can prevent enough oxygen from reaching certain areas of your body when you are active. It can be serious if it keeps enough oxygen from reaching your brain and other parts of your body. Bradycardia is not a problem for everyone. For some healthy adults, a slow resting heart rate is normal. What are the causes? This condition may be caused by:  A problem with the heart, including: ? A problem with the heart's electrical system, such as a heart block. ? A problem with the heart's natural pacemaker (sinus node). ?  Heart disease. ? A heart attack. ? Heart damage. ? A heart infection. ? A heart condition that is present at birth (congenital heart defect).  Certain medicines that treat heart conditions.  Certain conditions, such as hypothyroidism and obstructive sleep apnea.  Problems with the balance of chemicals and other substances, like potassium, in the blood.  What increases the risk? This condition is more likely to develop in adults who:  Are age 45 or older.  Have  high blood pressure (hypertension), high cholesterol (hyperlipidemia), or diabetes.  Drink heavily, use tobacco or nicotine products, or use drugs.  Are stressed.  What are the signs or symptoms? Symptoms of this condition include:  Light-headedness.  Feeling faint or fainting.  Fatigue and weakness.  Shortness of breath.  Chest pain (angina).  Drowsiness.  Confusion.  Dizziness.  How is this diagnosed? This condition may be diagnosed based on:  Your symptoms.  Your medical history.  A physical exam.  During the exam, your health care provider will listen to your heartbeat and check your pulse. To confirm the diagnosis, your health care provider may order tests, such as:  Blood tests.  An electrocardiogram (ECG). This test records the heart's electrical activity. The test can show how fast your heart is beating and whether the heartbeat is steady.  A test in which you wear a portable device (event recorder or Holter monitor) to record your heart's electrical activity while you go about your day.  Anexercise test.  How is this treated? Treatment for this condition depends on the cause of the condition and how severe your symptoms are. Treatment may involve:  Treatment of the underlying condition.  Changing your medicines or how much medicine you take.  Having a small, battery-operated device called a pacemaker implanted under the skin. When bradycardia occurs, this device can be used to increase your heart rate and help your heart to beat in a regular rhythm.  Follow these instructions at home: Lifestyle   Manage any health conditions that contribute to bradycardia as told by your health care provider.  Follow a heart-healthy diet. A nutrition specialist (dietitian) can help to educate you about healthy food options and changes.  Follow an exercise program that is approved by your health care provider.  Maintain a healthy weight.  Try to reduce or  manage your stress, such as with yoga or meditation. If you need help reducing stress, ask your health care provider.  Do not use use any products that contain nicotine or tobacco, such as cigarettes and e-cigarettes. If you need help quitting, ask your health care provider.  Do not use illegal drugs.  Limit alcohol intake to no more than 1 drink per day for nonpregnant women and 2 drinks per day for men. One drink equals 12 oz of beer, 5 oz of wine, or 1 oz of hard liquor. General instructions  Take over-the-counter and prescription medicines only as told by your health care provider.  Keep all follow-up visits as directed by your health care provider. This is important. How is this prevented? In some cases, bradycardia may be prevented by:  Treating underlying medical problems.  Stopping behaviors or medicines that can trigger the condition.  Contact a health care provider if:  You feel light-headed or dizzy.  You almost faint.  You feel weak or are easily fatigued during physical activity.  You experience confusion or have memory problems. Get help right away if:  You faint.  You have an irregular heartbeat (palpitations).  You have chest pain.  You have trouble breathing. This information is not intended to replace advice given to you by your health care provider. Make sure you discuss any questions you have with your health care provider. Document Released: 10/25/2001 Document Revised: 10/01/2015 Document Reviewed: 07/25/2015 Elsevier Interactive Patient Education  2017 Elsevier Inc.   Dizziness Dizziness is a common problem. It is a feeling of unsteadiness or light-headedness. You may feel like you are about to faint. Dizziness can lead to injury if you stumble or fall. Anyone can become dizzy, but dizziness is more common in older adults. This condition can be caused by a number of things, including medicines, dehydration, or illness. Follow these instructions at  home: Taking these steps may help with your condition: Eating and drinking  Drink enough fluid to keep your urine clear or pale yellow. This helps to keep you from becoming dehydrated. Try to drink more clear fluids, such as water.  Do not drink alcohol.  Limit your caffeine intake if directed by your health care provider.  Limit your salt intake if directed by your health care provider. Activity  Avoid making quick movements. ? Rise slowly from chairs and steady yourself until you feel okay. ? In the morning, first sit up on the side of the bed. When you feel okay, stand slowly while you hold onto something until you know that your balance is fine.  Move your legs often if you need to stand in one place for a long time. Tighten and relax your muscles in your legs while you are standing.  Do not drive or operate heavy machinery if you feel dizzy.  Avoid bending down if you feel dizzy. Place items in your home so that they are easy for you to reach without leaning over. Lifestyle  Do not use any tobacco products, including cigarettes, chewing tobacco, or electronic cigarettes. If you need help quitting, ask your health care provider.  Try to reduce your stress level, such as with yoga or meditation. Talk with your health care provider if you need help. General instructions  Watch your dizziness for any changes.  Take medicines only as directed by your health care provider. Talk with your health care provider if you think that your dizziness is caused by a medicine that you are taking.  Tell a friend or a family member that you are feeling dizzy. If he or she notices any changes in your behavior, have this person call your health care provider.  Keep all follow-up visits as directed by your health care provider. This is important. Contact a health care provider if:  Your dizziness does not go away.  Your dizziness or light-headedness gets worse.  You feel nauseous.  You have  reduced hearing.  You have new symptoms.  You are unsteady on your feet or you feel like the room is spinning. Get help right away if:  You vomit or have diarrhea and are unable to eat or drink anything.  You have problems talking, walking, swallowing, or using your arms, hands, or legs.  You feel generally weak.  You are not thinking clearly or you have trouble forming sentences. It may take a friend or family member to notice this.  You have chest pain, abdominal pain, shortness of breath, or sweating.  Your vision changes.  You notice any bleeding.  You have a headache.  You have neck pain or a stiff neck.  You have a fever. This information is not intended to  replace advice given to you by your health care provider. Make sure you discuss any questions you have with your health care provider. Document Released: 07/29/2000 Document Revised: 07/11/2015 Document Reviewed: 01/29/2014 Elsevier Interactive Patient Education  2017 [employer].   IF you received an x-ray today, you will receive an invoice from [facility]. Please contact [facility] at [phone] with questions or concerns regarding your invoice.   IF you received labwork today, you will receive an invoice from [place]. Please contact [facility] at [phone] with questions or concerns regarding your invoice.   Our billing staff will not be able to assist you with questions regarding bills from these companies.  You will be contacted with the lab results as soon as they are available. The fastest way to get your results is to activate your My Chart account. Instructions are located on the last page of this paperwork. If you have not heard from [place] regarding the results in 2 weeks, please contact this office.

## 2016-09-14 NOTE — Progress Notes (Signed)
By signing my name below, I, Mesha Guinyard, attest that this documentation has been prepared under the direction and in the presence of Meredith Staggers, MD.  Electronically Signed: Arvilla Market, Medical Scribe. 09/14/16. 3:45 PM.  Subjective:    Patient ID: [name], female    DOB: 14-Feb-1971, 46 y.o.   MRN: [id number]  HPI Chief Complaint  Patient presents with  . Medication Refill    lisinopril  . Dizziness    when getting up from sitting or laying    HPI Comments: [name] is a 46 y.o. female who presents to Primary Care at [facility] for HTN follow-up with dizziness onset 2 weeks ago. Pt's native language is [place] so she used her daughter to translate for her and declined the use of interpreter. She was last seen for a sore throat in March 2018. Previously HTN was discussed with Dr. Zachery Dauer in Aug 2017. BP at that visit was 132/78. She was on lisinopril-HCTZ 10/12.5 mg at that visit. She was seen in the ER July 4th. Pt ranout of her bp medication for a month prior to ER visit for foot laceration. They did provided a 1x curtesy refill for HCTZ; bp 171/98, and heart rate 56.   Her last dose of lisinopril-HCTZ 10/12.5 mg was yesterday. She didn't take any medication today, and she typically takes medication in the morning. Her systolic bp measures 130-140 while on medication and it never gets lower. Reports dizziness with standing after laying or sitting onset 2 weeks ago, regardless of how fast she gets up and center chest pain a couple of days prior to July 4th ER visit. Chest pain occurs with stretches, and palpations. She also notes myalgias, especially neck pain, and fatigue. Pt often uses her hands for work.   Bradycardia: Pulse was 84 in March. Low at 56 in the ER July 4th, and low at 54 today.  Patient Active Problem List   Diagnosis Date Noted  . Upper back pain 07/11/2014  . Essential hypertension 03/26/2010  . HEADACHE, CHRONIC 03/26/2010   Past Medical History:    Diagnosis Date  . Hypertension    Past Surgical History:  Procedure Laterality Date  . CESAREAN SECTION    . TUBAL LIGATION     No Known Allergies Prior to Admission medications   Medication Sig Start Date End Date Taking? Authorizing Provider  lisinopril-hydrochlorothiazide (ZESTORETIC) 10-12.5 MG tablet Take 1 tablet by mouth daily. 08/19/16  Yes Street, [place], PA-C  amoxicillin (AMOXIL) 500 MG capsule Take 1 capsule (500 mg total) by mouth 3 (three) times daily. Patient not taking: Reported on 09/14/2016 04/28/16   Shade Flood, MD  doxycycline (VIBRAMYCIN) 100 MG capsule Take 1 capsule (100 mg total) by mouth 2 (two) times daily. One po bid x 7 days Patient not taking: Reported on 09/14/2016 08/19/16   Street, [place], PA-C  naproxen sodium (ANAPROX) 220 MG tablet Take 220 mg by mouth 2 (two) times daily as needed (for pain).    [provider]   Social History   Social History  . Marital status: Married    Spouse name: N/A  . Number of children: N/A  . Years of education: N/A   Occupational History  . Not on file.   Social History Main Topics  . Smoking status: Never Smoker  . Smokeless tobacco: Never Used  . Alcohol use No  . Drug use: No  . Sexual activity: Not on file   Other Topics Concern  . Not  on file   Social History Narrative  . No narrative on file   Review of Systems  Constitutional: Positive for fatigue.  Cardiovascular: Positive for chest pain (chest wall).  Musculoskeletal: Positive for myalgias and neck pain.  Neurological: Positive for dizziness.    Objective:  Physical Exam  Constitutional: She appears well-developed and well-nourished. No distress.  HENT:  Head: Normocephalic and atraumatic.  Eyes: Conjunctivae are normal.  Neck: Neck supple.  trapezius and paraspinal in upper back with slight spasm  Cardiovascular: Regular rhythm.  Bradycardia present.  Exam reveals no gallop and no friction rub.   No murmur  heard. Pulmonary/Chest: Effort normal and breath sounds normal. No respiratory distress. She has no wheezes. She has no rales. She exhibits tenderness.  Able to reproduce right chest wall pain  Neurological: She is alert.  Skin: Skin is warm and dry.  Psychiatric: She has a normal mood and affect. Her behavior is normal.  Nursing note and vitals reviewed.   Vitals:   09/14/16 1516  BP: 134/76  Pulse: (!) 54  Resp: 16  Temp: 98.1 F (36.7 C)  TempSrc: Oral  SpO2: 96%  Weight: 147 lb (66.7 kg)  Height: 4' 10.5" (1.486 m)  Body mass index is 30.2 kg/m.   Orthostatic VS for the past 24 hrs:  BP- Lying Pulse- Lying BP- Sitting Pulse- Sitting BP- Standing at 0 minutes Pulse- Standing at 0 minutes  09/14/16 1526 145/83 58 150/82 59 133/82 53   [EKG Reading]: Sinus bradycardia. Rate 52. PR 164. Negative T-waves in lead 1, V4-V6. Flat T-wave AVF. No previous EKG for comparison.  Assessment & Plan:    [name] is a 46 y.o. female Dizziness - Plan: Orthostatic vital signs, Basic metabolic panel, EKG 12-Lead, lisinopril-hydrochlorothiazide (ZESTORETIC) 10-12.5 MG tablet Essential hypertension - Plan: Orthostatic vital signs, Basic metabolic panel, lisinopril-hydrochlorothiazide (ZESTORETIC) 10-12.5 MG tablet Bradycardia - Plan: EKG 12-Lead, TSH, Ambulatory referral to Cardiology  - Blood pressure overall stable. Episodic/orthostatic dizziness may be related to bradycardia as above. Referred to cardiology for further evaluation. TSH pending.  -Continue same dose of lisinopril/HCTZ for now.  - Myalgia  -Likely paraspinal muscles, trapezius due to overhead/frequent use of arms with work. RTC precautions if acute worsening, but advised to follow-up to discuss further.   -Also notes chest wall pain, that is reproducible with exam. Tylenol okay for both of these areas, ER/RTC precautions if new or worsening chest pain.  Language barrier  -Offered video interpreter, but that was declined.  Daughter translated with understanding expressed.  Abnormal EKG - Plan: Ambulatory referral to Cardiology  -Few negative T waves as above without acute findings for ST elevation at this time. Eval with cardiology.ER/911 precautions   Meds ordered this encounter  Medications  . lisinopril-hydrochlorothiazide (ZESTORETIC) 10-12.5 MG tablet    Sig: Take 1 tablet by mouth daily.    Dispense:  30 tablet    Refill:  2   Patient Instructions    I will check thyroid test for current slow heart rate, but would like you to meet with cardiologist as there are some possible abnormalities on your EKG.  Heart rate is also slower past few months, compared to previous readings.  For now I will continue same dose of medication, but check your blood pressures 1-2 times per day this week and if any lower blood pressure readings less than (120 on top number), would likely need to decrease the dose of medicines.  I will refer you to cardiologist to  discuss this and your abnormal EKG further.  Chest pain appears to be chest wall pain - see info below. If any worsening chest pain.   Return to the clinic or go to the nearest emergency room if any of your symptoms worsen or new symptoms occur.  Muscle aches in the upper back are likely due to type of work. Heat, gentle range of motion and stretches are okay for now, Tylenol is okay for now, please follow-up if the symptoms persist.   Chest Wall Pain Chest wall pain is pain in or around the bones and muscles of your chest. Sometimes, an injury causes this pain. Sometimes, the cause may not be known. This pain may take several weeks or longer to get better. Follow these instructions at home: Pay attention to any changes in your symptoms. Take these actions to help with your pain:  Rest as told by your health care provider.  Avoid activities that cause pain. These include any activities that use your chest muscles or your abdominal and side muscles to lift heavy  items.  If directed, apply ice to the painful area: ? Put ice in a plastic bag. ? Place a towel between your skin and the bag. ? Leave the ice on for 20 minutes, 2-3 times per day.  Take over-the-counter and prescription medicines only as told by your health care provider.  Do not use tobacco products, including cigarettes, chewing tobacco, and e-cigarettes. If you need help quitting, ask your health care provider.  Keep all follow-up visits as told by your health care provider. This is important.  Contact a health care provider if:  You have a fever.  Your chest pain becomes worse.  You have new symptoms. Get help right away if:  You have nausea or vomiting.  You feel sweaty or light-headed.  You have a cough with phlegm (sputum) or you cough up blood.  You develop shortness of breath. This information is not intended to replace advice given to you by your health care provider. Make sure you discuss any questions you have with your health care provider. Document Released: 02/02/2005 Document Revised: 06/13/2015 Document Reviewed: 04/30/2014 Elsevier Interactive Patient Education  2017 Elsevier Inc.   Bradycardia, Adult Bradycardia is a slower-than-normal heartbeat. A normal resting heart rate for an adult ranges from 60 to 100 beats per minute. With bradycardia, the resting heart rate is less than 60 beats per minute. Bradycardia can prevent enough oxygen from reaching certain areas of your body when you are active. It can be serious if it keeps enough oxygen from reaching your brain and other parts of your body. Bradycardia is not a problem for everyone. For some healthy adults, a slow resting heart rate is normal. What are the causes? This condition may be caused by:  A problem with the heart, including: ? A problem with the heart's electrical system, such as a heart block. ? A problem with the heart's natural pacemaker (sinus node). ? Heart disease. ? A heart  attack. ? Heart damage. ? A heart infection. ? A heart condition that is present at birth (congenital heart defect).  Certain medicines that treat heart conditions.  Certain conditions, such as hypothyroidism and obstructive sleep apnea.  Problems with the balance of chemicals and other substances, like potassium, in the blood.  What increases the risk? This condition is more likely to develop in adults who:  Are age 60 or older.  Have high blood pressure (hypertension), high cholesterol (hyperlipidemia), or diabetes.  Drink heavily, use tobacco or nicotine products, or use drugs.  Are stressed.  What are the signs or symptoms? Symptoms of this condition include:  Light-headedness.  Feeling faint or fainting.  Fatigue and weakness.  Shortness of breath.  Chest pain (angina).  Drowsiness.  Confusion.  Dizziness.  How is this diagnosed? This condition may be diagnosed based on:  Your symptoms.  Your medical history.  A physical exam.  During the exam, your health care provider will listen to your heartbeat and check your pulse. To confirm the diagnosis, your health care provider may order tests, such as:  Blood tests.  An electrocardiogram (ECG). This test records the heart's electrical activity. The test can show how fast your heart is beating and whether the heartbeat is steady.  A test in which you wear a portable device (event recorder or Holter monitor) to record your heart's electrical activity while you go about your day.  Anexercise test.  How is this treated? Treatment for this condition depends on the cause of the condition and how severe your symptoms are. Treatment may involve:  Treatment of the underlying condition.  Changing your medicines or how much medicine you take.  Having a small, battery-operated device called a pacemaker implanted under the skin. When bradycardia occurs, this device can be used to increase your heart rate and  help your heart to beat in a regular rhythm.  Follow these instructions at home: Lifestyle   Manage any health conditions that contribute to bradycardia as told by your health care provider.  Follow a heart-healthy diet. A nutrition specialist (dietitian) can help to educate you about healthy food options and changes.  Follow an exercise program that is approved by your health care provider.  Maintain a healthy weight.  Try to reduce or manage your stress, such as with yoga or meditation. If you need help reducing stress, ask your health care provider.  Do not use use any products that contain nicotine or tobacco, such as cigarettes and e-cigarettes. If you need help quitting, ask your health care provider.  Do not use illegal drugs.  Limit alcohol intake to no more than 1 drink per day for nonpregnant women and 2 drinks per day for men. One drink equals 12 oz of beer, 5 oz of wine, or 1 oz of hard liquor. General instructions  Take over-the-counter and prescription medicines only as told by your health care provider.  Keep all follow-up visits as directed by your health care provider. This is important. How is this prevented? In some cases, bradycardia may be prevented by:  Treating underlying medical problems.  Stopping behaviors or medicines that can trigger the condition.  Contact a health care provider if:  You feel light-headed or dizzy.  You almost faint.  You feel weak or are easily fatigued during physical activity.  You experience confusion or have memory problems. Get help right away if:  You faint.  You have an irregular heartbeat (palpitations).  You have chest pain.  You have trouble breathing. This information is not intended to replace advice given to you by your health care provider. Make sure you discuss any questions you have with your health care provider. Document Released: 10/25/2001 Document Revised: 10/01/2015 Document Reviewed:  07/25/2015 Elsevier Interactive Patient Education  2017 Elsevier Inc.   Dizziness Dizziness is a common problem. It is a feeling of unsteadiness or light-headedness. You may feel like you are about to faint. Dizziness can lead to injury if you stumble or  fall. Anyone can become dizzy, but dizziness is more common in older adults. This condition can be caused by a number of things, including medicines, dehydration, or illness. Follow these instructions at home: Taking these steps may help with your condition: Eating and drinking  Drink enough fluid to keep your urine clear or pale yellow. This helps to keep you from becoming dehydrated. Try to drink more clear fluids, such as water.  Do not drink alcohol.  Limit your caffeine intake if directed by your health care provider.  Limit your salt intake if directed by your health care provider. Activity  Avoid making quick movements. ? Rise slowly from chairs and steady yourself until you feel okay. ? In the morning, first sit up on the side of the bed. When you feel okay, stand slowly while you hold onto something until you know that your balance is fine.  Move your legs often if you need to stand in one place for a long time. Tighten and relax your muscles in your legs while you are standing.  Do not drive or operate heavy machinery if you feel dizzy.  Avoid bending down if you feel dizzy. Place items in your home so that they are easy for you to reach without leaning over. Lifestyle  Do not use any tobacco products, including cigarettes, chewing tobacco, or electronic cigarettes. If you need help quitting, ask your health care provider.  Try to reduce your stress level, such as with yoga or meditation. Talk with your health care provider if you need help. General instructions  Watch your dizziness for any changes.  Take medicines only as directed by your health care provider. Talk with your health care provider if you think that  your dizziness is caused by a medicine that you are taking.  Tell a friend or a family member that you are feeling dizzy. If he or she notices any changes in your behavior, have this person call your health care provider.  Keep all follow-up visits as directed by your health care provider. This is important. Contact a health care provider if:  Your dizziness does not go away.  Your dizziness or light-headedness gets worse.  You feel nauseous.  You have reduced hearing.  You have new symptoms.  You are unsteady on your feet or you feel like the room is spinning. Get help right away if:  You vomit or have diarrhea and are unable to eat or drink anything.  You have problems talking, walking, swallowing, or using your arms, hands, or legs.  You feel generally weak.  You are not thinking clearly or you have trouble forming sentences. It may take a friend or family member to notice this.  You have chest pain, abdominal pain, shortness of breath, or sweating.  Your vision changes.  You notice any bleeding.  You have a headache.  You have neck pain or a stiff neck.  You have a fever. This information is not intended to replace advice given to you by your health care provider. Make sure you discuss any questions you have with your health care provider. Document Released: 07/29/2000 Document Revised: 07/11/2015 Document Reviewed: 01/29/2014 Elsevier Interactive Patient Education  2017 [employer].   IF you received an x-ray today, you will receive an invoice from [facility]. Please contact [facility] at [phone] with questions or concerns regarding your invoice.   IF you received labwork today, you will receive an invoice from [facility]. Please contact [facility] at [phone] with questions  or concerns regarding your invoice.   Our billing staff will not be able to assist you with questions regarding bills from these companies.  You will be contacted  with the lab results as soon as they are available. The fastest way to get your results is to activate your My Chart account. Instructions are located on the last page of this paperwork. If you have not heard from us regarding the results in 2 weeks, please contact this office.        I personally performed the services described in this documentation, which was scribed in my presence. The recorded information has been reviewed and considered for accuracy and completeness, addended by me as needed, and agree with information above.  Signed,   Meredith StaggersJeffrey Jhanvi Drakeford, MD Primary Care at [facility].  09/16/16 4:30 PM

## 2016-09-14 NOTE — Telephone Encounter (Signed)
Rx refilled by provider  

## 2016-09-17 LAB — BASIC METABOLIC PANEL

## 2016-09-17 LAB — TSH

## 2016-09-27 ENCOUNTER — Other Ambulatory Visit: Payer: Self-pay | Admitting: Family Medicine

## 2016-09-27 DIAGNOSIS — R001 Bradycardia, unspecified: Secondary | ICD-10-CM

## 2016-09-27 NOTE — Progress Notes (Signed)
Lab only order as not run on previous lab work. See prior lab notes

## 2016-10-07 ENCOUNTER — Encounter: Payer: Self-pay | Admitting: Urgent Care

## 2016-10-07 ENCOUNTER — Ambulatory Visit (INDEPENDENT_AMBULATORY_CARE_PROVIDER_SITE_OTHER): Payer: Self-pay | Admitting: Urgent Care

## 2016-10-07 VITALS — BP 116/71 | HR 53 | Temp 98.0°F | Resp 16 | Ht 58.5 in | Wt 142.4 lb

## 2016-10-07 DIAGNOSIS — R519 Headache, unspecified: Secondary | ICD-10-CM

## 2016-10-07 DIAGNOSIS — R51 Headache: Secondary | ICD-10-CM

## 2016-10-07 DIAGNOSIS — R42 Dizziness and giddiness: Secondary | ICD-10-CM

## 2016-10-07 DIAGNOSIS — R112 Nausea with vomiting, unspecified: Secondary | ICD-10-CM

## 2016-10-07 DIAGNOSIS — I10 Essential (primary) hypertension: Secondary | ICD-10-CM

## 2016-10-07 LAB — POCT URINALYSIS DIP (MANUAL ENTRY)
BILIRUBIN UA: NEGATIVE
GLUCOSE UA: NEGATIVE mg/dL
Ketones, POC UA: NEGATIVE mg/dL
LEUKOCYTES UA: NEGATIVE
NITRITE UA: NEGATIVE
Protein Ur, POC: NEGATIVE mg/dL
RBC UA: NEGATIVE
Spec Grav, UA: 1.01 (ref 1.010–1.025)
Urobilinogen, UA: 0.2 E.U./dL
pH, UA: 7 (ref 5.0–8.0)

## 2016-10-07 LAB — POCT CBC
Granulocyte percent: 61.9 %G (ref 37–80)
HCT, POC: 41.3 % (ref 37.7–47.9)
Hemoglobin: 13.6 g/dL (ref 12.2–16.2)
Lymph, poc: 2.6 (ref 0.6–3.4)
MCH: 29 pg (ref 27–31.2)
MCHC: 32.9 g/dL (ref 31.8–35.4)
MCV: 88.1 fL (ref 80–97)
MID (CBC): 0.4 (ref 0–0.9)
MPV: 7.9 fL (ref 0–99.8)
POC Granulocyte: 4.8 (ref 2–6.9)
POC LYMPH PERCENT: 33.5 %L (ref 10–50)
POC MID %: 4.6 % (ref 0–12)
Platelet Count, POC: 308 10*3/uL (ref 142–424)
RBC: 4.68 M/uL (ref 4.04–5.48)
RDW, POC: 12.6 %
WBC: 7.8 10*3/uL (ref 4.6–10.2)

## 2016-10-07 LAB — POCT GLYCOSYLATED HEMOGLOBIN (HGB A1C): Hemoglobin A1C: 6.2

## 2016-10-07 MED ORDER — MELOXICAM 7.5 MG PO TABS: 7.5000 mg | ORAL_TABLET | Freq: Every day | ORAL | 0 refills | Status: DC

## 2016-10-07 MED ORDER — CYCLOBENZAPRINE HCL 5 MG PO TABS: 5.0000 mg | ORAL_TABLET | Freq: Every day | ORAL | 1 refills | Status: DC

## 2016-10-07 MED ORDER — KETOROLAC TROMETHAMINE 60 MG/2ML IM SOLN
60.0000 mg | Freq: Once | INTRAMUSCULAR | Status: AC
  Administered 2016-10-07: 60 mg via INTRAMUSCULAR

## 2016-10-07 NOTE — Patient Instructions (Addendum)
General Headache Without Cause A headache is pain or discomfort felt around the head or neck area. The specific cause of a headache may not be found. There are many causes and types of headaches. A few common ones are:  Tension headaches.  Migraine headaches.  Cluster headaches.  Chronic daily headaches. Follow these instructions at home: Watch your condition for any changes. Take these steps to help with your condition: Managing pain   Take over-the-counter and prescription medicines only as told by your health care provider.  Lie down in a dark, quiet room when you have a headache.  If directed, apply ice to the head and neck area:  Put ice in a plastic bag.  Place a towel between your skin and the bag.  Leave the ice on for 20 minutes, 2-3 times per day.  Use a heating pad or hot shower to apply heat to the head and neck area as told by your health care provider.  Keep lights dim if bright lights bother you or make your headaches worse. Eating and drinking   Eat meals on a regular schedule.  Limit alcohol use.  Decrease the amount of caffeine you drink, or stop drinking caffeine. General instructions   Keep all follow-up visits as told by your health care provider. This is important.  Keep a headache journal to help find out what may trigger your headaches. For example, write down:  What you eat and drink.  How much sleep you get.  Any change to your diet or medicines.  Try massage or other relaxation techniques.  Limit stress.  Sit up straight, and do not tense your muscles.  Do not use tobacco products, including cigarettes, chewing tobacco, or e-cigarettes. If you need help quitting, ask your health care provider.  Exercise regularly as told by your health care provider.  Sleep on a regular schedule. Get 7-9 hours of sleep, or the amount recommended by your health care provider. Contact a health care provider if:  Your symptoms are not helped by  medicine.  You have a headache that is different from the usual headache.  You have nausea or you vomit.  You have a fever. Get help right away if:  Your headache becomes severe.  You have repeated vomiting.  You have a stiff neck.  You have a loss of vision.  You have problems with speech.  You have pain in the eye or ear.  You have muscular weakness or loss of muscle control.  You lose your balance or have trouble walking.  You feel faint or pass out.  You have confusion. This information is not intended to replace advice given to you by your health care provider. Make sure you discuss any questions you have with your health care provider. Document Released: 02/02/2005 Document Revised: 07/11/2015 Document Reviewed: 05/28/2014 Elsevier Interactive Patient Education  2017 Elsevier Inc.     IF you received an x-ray today, you will receive an invoice from [facility]. Please contact [facility] at [phone] with questions or concerns regarding your invoice.   IF you received labwork today, you will receive an invoice from [facility]. Please contact [facility] at [phone] with questions or concerns regarding your invoice.   Our billing staff will not be able to assist you with questions regarding bills from these companies.  You will be contacted with the lab results as soon as they are available. The fastest way to get your results is to activate your My Chart account. Instructions are   located on the last page of this paperwork. If you have not heard from us regarding the results in 2 weeks, please contact this office.    We recommend that you schedule a mammogram for breast cancer screening. Typically, you do not need a referral to do this. Please contact a local imaging center to schedule your mammogram.  [facility] - [phone]  *ask for the Radiology Department [facility] - [phone] or [phone]  [facility] - [phone] [facility] - [phone] [facility] - [phone]  *ask for the Radiology Department [facility] - [phone]  *ask for the Radiology Department [facility] - [phone]  *ask for the Mammography Department [facility] - [phone] 

## 2016-10-07 NOTE — Progress Notes (Addendum)
Patient refused translation by anyone besides her daughter.  MRN: [id number] DOB: 03-12-1970  Subjective:   [name] is a 46 y.o. female presenting for chief complaint of Headache and Emesis  Reports 2 month history of intermittent headaches. In the past day, patient has had left-sided throbbing headache over temporal, frontal portion of her head. Has associated photosensitivity, dizziness, red eyes with her headache, neck pain and stiffness. Has also had headaches over right-side that then moves to her left head. Has tried ibuprofen with temporary relief. Also reports nausea with vomiting x1 episode, not relieved with otc medication. She has not worked in the past month, previously in production labeling bottles, was lifting somewhat heavy boxes. Unfortunately she had to stop working due to the headaches and associated dizziness. Drinks 2 bottles of water daily. Sleeps ~6-7 hours per night, has difficulty sleeping. Denies fever, confusion, weakness, numbness, tingling, sinus congestion, sinus pain, ear pain, ear drainage, tinnitus, cough, sore throat, chest pain, abdominal pain, rashes. Denies smoking cigarettes or drinking alcohol. Denies coffee use in the past month. Before she used to have 1/2 cup of coffee. Of note, patient has had dizziness and atypical chest pain over the past couple of months, last OV for this was on 09/14/2016.   [name] has a current medication list which includes the following prescription(s): lisinopril-hydrochlorothiazide and naproxen sodium. Also has No Known Allergies.  [name]  has a past medical history of Hypertension. Also  has a past surgical history that includes Cesarean section and Tubal ligation.  Objective:   Vitals: BP 116/71 (BP Location: Right Arm, Patient Position: Sitting, Cuff Size: Normal)   Pulse (!) 53   Temp 98 F (36.7 C) (Oral)   Resp 16   Ht 4' 10.5" (1.486 m)   Wt 142 lb 6.4 oz (64.6 kg)   LMP 10/01/2016   SpO2 98%   BMI 29.26 kg/m    Orthostatic VS for the past 24 hrs:  BP- Lying Pulse- Lying BP- Sitting Pulse- Sitting BP- Standing at 0 minutes Pulse- Standing at 0 minutes  10/07/16 1442 - 55 - 65 - 60  10/07/16 1441 104/68 - 116/62 - 102/60 -   BP Readings from Last 3 Encounters:  10/07/16 116/71  09/14/16 134/76  08/19/16 (!) 171/98    Wt Readings from Last 3 Encounters:  10/07/16 142 lb 6.4 oz (64.6 kg)  09/14/16 147 lb (66.7 kg)  04/28/16 147 lb (66.7 kg)   Physical Exam  Constitutional: She is oriented to person, place, and time. She appears well-developed and well-nourished.  HENT:  TM's intact bilaterally, no effusions or erythema. Nasal turbinates pink and moist, nasal passages patent. No sinus tenderness. Oropharynx clear, mucous membranes moist.  Eyes: Pupils are equal, round, and reactive to light. EOM are normal. No scleral icterus.  Neck: Normal range of motion. Neck supple. No thyromegaly present.  Cardiovascular: Normal rate, regular rhythm and intact distal pulses.  Exam reveals no gallop and no friction rub.   No murmur heard. Pulmonary/Chest: No respiratory distress. She has no wheezes. She has no rales.  Abdominal: Soft. Bowel sounds are normal. She exhibits no distension and no mass. There is no tenderness. There is no guarding.  Musculoskeletal:       Cervical back: She exhibits tenderness (over spasms) and spasm (significant spasms over trapezius bilaterally and paraspinal muscles of neck). She exhibits normal range of motion, no bony tenderness, no swelling, no edema and no deformity.  Lymphadenopathy:    She has no cervical  adenopathy.  Neurological: She is alert and oriented to person, place, and time. She displays normal reflexes. No cranial nerve deficit. Coordination normal.  Heel to shin, finger to nose tests intact.  Skin: Skin is warm and dry. No rash noted.  Psychiatric: She has a normal mood and affect.   Results for orders placed or performed in visit on 10/07/16 (from the  past 24 hour(s))  POCT CBC     Status: None   Collection Time: 10/07/16  2:28 PM  Result Value Ref Range   WBC 7.8 4.6 - 10.2 K/uL   Lymph, poc 2.6 0.6 - 3.4   POC LYMPH PERCENT 33.5 10 - 50 %L   MID (cbc) 0.4 0 - 0.9   POC MID % 4.6 0 - 12 %M   POC Granulocyte 4.8 2 - 6.9   Granulocyte percent 61.9 37 - 80 %G   RBC 4.68 4.04 - 5.48 M/uL   Hemoglobin 13.6 12.2 - 16.2 g/dL   HCT, POC 28.3 66.2 - 47.9 %   MCV 88.1 80 - 97 fL   MCH, POC 29.0 27 - 31.2 pg   MCHC 32.9 31.8 - 35.4 g/dL   RDW, POC 94.7 %   Platelet Count, POC 308 142 - 424 K/uL   MPV 7.9 0 - 99.8 fL  POCT urinalysis dipstick     Status: None   Collection Time: 10/07/16  2:29 PM  Result Value Ref Range   Color, UA yellow yellow   Clarity, UA clear clear   Glucose, UA negative negative mg/dL   Bilirubin, UA negative negative   Ketones, POC UA negative negative mg/dL   Spec Grav, UA 6.546 5.035 - 1.025   Blood, UA negative negative   pH, UA 7.0 5.0 - 8.0   Protein Ur, POC negative negative mg/dL   Urobilinogen, UA 0.2 0.2 or 1.0 E.U./dL   Nitrite, UA Negative Negative   Leukocytes, UA Negative Negative  POCT glycosylated hemoglobin (Hb A1C)     Status: None   Collection Time: 10/07/16  2:29 PM  Result Value Ref Range   Hemoglobin A1C 6.2    Assessment and Plan :   1. Frequent headaches 2. Acute nonintractable headache, unspecified headache type 3. Non-intractable vomiting with nausea, unspecified vomiting type 4. Dizziness - Headaches are tension type. I counseled patient on adequate hydration and sleep, eating regular meals. Patient is to use meloxicam given side effects of drowsiness with naproxen. Use Flexeril for tension in her neck and trapezius muscles. Labs pending. Will f/u in 2 weeks if symptoms persist. Will call daughter with results, Juyn.    5. Essential hypertension - Well controlled.  Wallis Bamberg, PA-C Primary Care at [facility] [phone] 10/07/2016  1:44 PM

## 2016-10-08 LAB — BASIC METABOLIC PANEL
BUN/Creatinine Ratio: 17 (ref 9–23)
BUN: 12 mg/dL (ref 6–24)
CALCIUM: 9.5 mg/dL (ref 8.7–10.2)
CHLORIDE: 100 mmol/L (ref 96–106)
CO2: 25 mmol/L (ref 20–29)
Creatinine, Ser: 0.7 mg/dL (ref 0.57–1.00)
GFR calc Af Amer: 121 mL/min/{1.73_m2} (ref >59)
GFR calc non Af Amer: 105 mL/min/{1.73_m2} (ref >59)
GLUCOSE: 92 mg/dL (ref 65–99)
POTASSIUM: 3.9 mmol/L (ref 3.5–5.2)
Sodium: 141 mmol/L (ref 134–144)

## 2016-10-08 LAB — TSH: TSH: 1.1 u[IU]/mL (ref 0.450–4.500)

## 2016-12-15 ENCOUNTER — Telehealth: Payer: Self-pay | Admitting: Urgent Care

## 2016-12-15 NOTE — Telephone Encounter (Signed)
Pt is needing a refill on her blood pressure meds and is trying to get an appt next week with mani for the office visit to check meds   Best number [phone]

## 2016-12-16 ENCOUNTER — Other Ambulatory Visit: Payer: Self-pay

## 2016-12-16 DIAGNOSIS — I10 Essential (primary) hypertension: Secondary | ICD-10-CM

## 2016-12-16 DIAGNOSIS — R42 Dizziness and giddiness: Secondary | ICD-10-CM

## 2016-12-16 MED ORDER — LISINOPRIL-HYDROCHLOROTHIAZIDE 10-12.5 MG PO TABS: 1.0000 | ORAL_TABLET | Freq: Every day | ORAL | 2 refills | Status: DC

## 2017-01-18 ENCOUNTER — Other Ambulatory Visit: Payer: Self-pay | Admitting: *Deleted

## 2017-01-18 DIAGNOSIS — I10 Essential (primary) hypertension: Secondary | ICD-10-CM

## 2017-01-18 DIAGNOSIS — R42 Dizziness and giddiness: Secondary | ICD-10-CM

## 2017-01-18 MED ORDER — LISINOPRIL-HYDROCHLOROTHIAZIDE 10-12.5 MG PO TABS: 1.0000 | ORAL_TABLET | Freq: Every day | ORAL | 0 refills | Status: DC

## 2017-02-14 ENCOUNTER — Other Ambulatory Visit: Payer: Self-pay | Admitting: Urgent Care

## 2017-02-14 DIAGNOSIS — R42 Dizziness and giddiness: Secondary | ICD-10-CM

## 2017-02-14 DIAGNOSIS — I10 Essential (primary) hypertension: Secondary | ICD-10-CM

## 2017-02-18 ENCOUNTER — Encounter: Payer: Self-pay | Admitting: Urgent Care

## 2017-02-18 ENCOUNTER — Ambulatory Visit (INDEPENDENT_AMBULATORY_CARE_PROVIDER_SITE_OTHER): Payer: Self-pay | Admitting: Urgent Care

## 2017-02-18 VITALS — BP 128/71 | HR 57 | Temp 98.1°F | Resp 16 | Ht 58.5 in | Wt 147.2 lb

## 2017-02-18 DIAGNOSIS — I10 Essential (primary) hypertension: Secondary | ICD-10-CM

## 2017-02-18 DIAGNOSIS — G44209 Tension-type headache, unspecified, not intractable: Secondary | ICD-10-CM

## 2017-02-18 MED ORDER — MELOXICAM 7.5 MG PO TABS: 7.5000 mg | ORAL_TABLET | Freq: Every day | ORAL | 2 refills | Status: DC

## 2017-02-18 MED ORDER — LISINOPRIL-HYDROCHLOROTHIAZIDE 10-12.5 MG PO TABS: 1.0000 | ORAL_TABLET | Freq: Every day | ORAL | 1 refills | Status: DC

## 2017-02-18 NOTE — Progress Notes (Signed)
   MRN: [id number] DOB: 1970-09-16  Subjective:   [name] is a 47 y.o. female presenting for follow up on Hypertension. Currently managed with lis-HCTZ. Patient is not checking blood pressure at home. Avoids salt in diet, is exercising. Reports that she still gets tension headaches intermittently 1-2 per month, does very well with meloxicam. Denies dizziness, blurred vision, chest pain, shortness of breath, heart racing, palpitations, nausea, vomiting, abdominal pain, hematuria, lower leg swelling. Denies smoking cigarettes or drinking alcohol. Of note, patient was last seen by her cardiologist 11/2016, had normal work up.  Maria AuerbachKhiak has a current medication list which includes the following prescription(s): lisinopril-hydrochlorothiazide. Also has No Known Allergies.  Maria AuerbachKhiak  has a past medical history of Hypertension. Also  has a past surgical history that includes Cesarean section and Tubal ligation.  Objective:   Vitals: BP 128/71   Pulse (!) 57   Temp 98.1 F (36.7 C) (Oral)   Resp 16   Ht 4' 10.5" (1.486 m)   Wt 147 lb 3.2 oz (66.8 kg)   SpO2 97%   BMI 30.24 kg/m   BP Readings from Last 3 Encounters:  02/18/17 128/71  10/07/16 116/71  09/14/16 134/76    The 10-year ASCVD risk score Denman George(Goff DC Jr., et al., 2013) is: 0.8%   Values used to calculate the score:     Age: [age] years     Sex: Female     Is Non-Hispanic African American: No     Diabetic: No     Tobacco smoker: No     Systolic Blood Pressure: 128 mmHg     Is BP treated: Yes     HDL Cholesterol: 57 mg/dL     Total Cholesterol: 172 mg/dL   Physical Exam  Constitutional: She is oriented to person, place, and time. She appears well-developed and well-nourished.  HENT:  Mouth/Throat: Oropharynx is clear and moist.  Eyes: No scleral icterus.  Cardiovascular: Normal rate, regular rhythm and intact distal pulses. Exam reveals no gallop and no friction rub.  No murmur heard. Pulmonary/Chest: No respiratory distress. She has  no wheezes. She has no rales.  Musculoskeletal: She exhibits no edema.  Neurological: She is alert and oriented to person, place, and time.  Skin: Skin is warm and dry.  Psychiatric: She has a normal mood and affect.   Assessment and Plan :   Essential hypertension - Plan: lisinopril-hydrochlorothiazide (PRINZIDE,ZESTORETIC) 10-12.5 MG tablet, CANCELED: Comprehensive metabolic panel, CANCELED: Lipid panel  Acute non intractable tension-type headache   Well controlled. Refilled lis-HCTZ, meloxicam. Return-to-clinic precautions discussed, patient verbalized understanding. Otherwise, follow up in 6 months.  Maria BambergMario Maria Lehew, PA-C Primary Care at [facility] [phone] 02/18/2017  11:00 AM

## 2017-02-18 NOTE — Patient Instructions (Signed)
Hypertension Hypertension is another name for high blood pressure. High blood pressure forces your heart to work harder to pump blood. This can cause problems over time. There are two numbers in a blood pressure reading. There is a top number (systolic) over a bottom number (diastolic). It is best to have a blood pressure below 120/80. Healthy choices can help lower your blood pressure. You may need medicine to help lower your blood pressure if:  Your blood pressure cannot be lowered with healthy choices.  Your blood pressure is higher than 130/80.  Follow these instructions at home: Eating and drinking  If directed, follow the DASH eating plan. This diet includes: ? Filling half of your plate at each meal with fruits and vegetables. ? Filling one quarter of your plate at each meal with whole grains. Whole grains include whole wheat pasta, brown rice, and whole grain bread. ? Eating or drinking low-fat dairy products, such as skim milk or low-fat yogurt. ? Filling one quarter of your plate at each meal with low-fat (lean) proteins. Low-fat proteins include fish, skinless chicken, eggs, beans, and tofu. ? Avoiding fatty meat, cured and processed meat, or chicken with skin. ? Avoiding premade or processed food.  Eat less than 1,500 mg of salt (sodium) a day.  Limit alcohol use to no more than 1 drink a day for nonpregnant women and 2 drinks a day for men. One drink equals 12 oz of beer, 5 oz of wine, or 1 oz of hard liquor. Lifestyle  Work with your doctor to stay at a healthy weight or to lose weight. Ask your doctor what the best weight is for you.  Get at least 30 minutes of exercise that causes your heart to beat faster (aerobic exercise) most days of the week. This may include walking, swimming, or biking.  Get at least 30 minutes of exercise that strengthens your muscles (resistance exercise) at least 3 days a week. This may include lifting weights or pilates.  Do not use any  products that contain nicotine or tobacco. This includes cigarettes and e-cigarettes. If you need help quitting, ask your doctor.  Check your blood pressure at home as told by your doctor.  Keep all follow-up visits as told by your doctor. This is important. Medicines  Take over-the-counter and prescription medicines only as told by your doctor. Follow directions carefully.  Do not skip doses of blood pressure medicine. The medicine does not work as well if you skip doses. Skipping doses also puts you at risk for problems.  Ask your doctor about side effects or reactions to medicines that you should watch for. Contact a doctor if:  You think you are having a reaction to the medicine you are taking.  You have headaches that keep coming back (recurring).  You feel dizzy.  You have swelling in your ankles.  You have trouble with your vision. Get help right away if:  You get a very bad headache.  You start to feel confused.  You feel weak or numb.  You feel faint.  You get very bad pain in your: ? Chest. ? Belly (abdomen).  You throw up (vomit) more than once.  You have trouble breathing. Summary  Hypertension is another name for high blood pressure.  Making healthy choices can help lower blood pressure. If your blood pressure cannot be controlled with healthy choices, you may need to take medicine. This information is not intended to replace advice given to you by your health care   provider. Make sure you discuss any questions you have with your health care provider. Document Released: 07/22/2007 Document Revised: 01/01/2016 Document Reviewed: 01/01/2016 Elsevier Interactive Patient Education  2018 Elsevier [employer].     IF you received an x-ray today, you will receive an invoice from [facility]. Please contact [facility] at [phone] with questions or concerns regarding your invoice.   IF you received labwork today, you will receive an invoice  from [facility]. Please contact [facility] at [phone] with questions or concerns regarding your invoice.   Our billing staff will not be able to assist you with questions regarding bills from these companies.  You will be contacted with the lab results as soon as they are available. The fastest way to get your results is to activate your My Chart account. Instructions are located on the last page of this paperwork. If you have not heard from us regarding the results in 2 weeks, please contact this office.      

## 2017-04-04 ENCOUNTER — Encounter (HOSPITAL_COMMUNITY): Payer: Self-pay | Admitting: Emergency Medicine

## 2017-04-04 ENCOUNTER — Observation Stay (HOSPITAL_COMMUNITY)
Admission: EM | Admit: 2017-04-04 | Discharge: 2017-04-05 | Disposition: A | Discharge status: Home / Self Care | Payer: Medicaid Other | Attending: Internal Medicine | Admitting: Internal Medicine

## 2017-04-04 ENCOUNTER — Other Ambulatory Visit: Payer: Self-pay

## 2017-04-04 DIAGNOSIS — E876 Hypokalemia: Secondary | ICD-10-CM | POA: Insufficient documentation

## 2017-04-04 DIAGNOSIS — I10 Essential (primary) hypertension: Secondary | ICD-10-CM | POA: Diagnosis present

## 2017-04-04 DIAGNOSIS — R21 Rash and other nonspecific skin eruption: Secondary | ICD-10-CM | POA: Diagnosis present

## 2017-04-04 DIAGNOSIS — Z79899 Other long term (current) drug therapy: Secondary | ICD-10-CM | POA: Insufficient documentation

## 2017-04-04 DIAGNOSIS — T7840XA Allergy, unspecified, initial encounter: Secondary | ICD-10-CM | POA: Diagnosis not present

## 2017-04-04 DIAGNOSIS — J111 Influenza due to unidentified influenza virus with other respiratory manifestations: Secondary | ICD-10-CM | POA: Diagnosis not present

## 2017-04-04 DIAGNOSIS — L509 Urticaria, unspecified: Secondary | ICD-10-CM | POA: Diagnosis not present

## 2017-04-04 LAB — I-STAT CHEM 8, ED
BUN: 15 mg/dL (ref 6–20)
Calcium, Ion: 1.11 mmol/L — ABNORMAL LOW (ref 1.15–1.40)
Chloride: 104 mmol/L (ref 101–111)
Creatinine, Ser: 0.7 mg/dL (ref 0.44–1.00)
Glucose, Bld: 162 mg/dL — ABNORMAL HIGH (ref 65–99)
HEMATOCRIT: 38 % (ref 36.0–46.0)
HEMOGLOBIN: 12.9 g/dL (ref 12.0–15.0)
Potassium: 3.4 mmol/L — ABNORMAL LOW (ref 3.5–5.1)
Sodium: 140 mmol/L (ref 135–145)
TCO2: 22 mmol/L (ref 22–32)

## 2017-04-04 MED ORDER — METHYLPREDNISOLONE SODIUM SUCC 125 MG IJ SOLR
125.0000 mg | Freq: Once | INTRAMUSCULAR | Status: AC
  Administered 2017-04-04: 125 mg via INTRAVENOUS
  Filled 2017-04-04: qty 2

## 2017-04-04 MED ORDER — HYDROXYZINE HCL 25 MG PO TABS
50.0000 mg | ORAL_TABLET | Freq: Once | ORAL | Status: AC
  Administered 2017-04-04: 50 mg via ORAL
  Filled 2017-04-04: qty 2

## 2017-04-04 MED ORDER — EPINEPHRINE 0.3 MG/0.3ML IJ SOAJ
0.3000 mg | Freq: Once | INTRAMUSCULAR | Status: AC
  Administered 2017-04-04: 0.3 mg via INTRAMUSCULAR
  Filled 2017-04-04: qty 0.3

## 2017-04-04 MED ORDER — DIPHENHYDRAMINE HCL 50 MG/ML IJ SOLN
25.0000 mg | Freq: Once | INTRAMUSCULAR | Status: AC
  Administered 2017-04-04: 25 mg via INTRAVENOUS
  Filled 2017-04-04: qty 1

## 2017-04-04 MED ORDER — FAMOTIDINE IN NACL 20-0.9 MG/50ML-% IV SOLN
20.0000 mg | Freq: Once | INTRAVENOUS | Status: AC
  Administered 2017-04-04: 20 mg via INTRAVENOUS
  Filled 2017-04-04: qty 50

## 2017-04-04 NOTE — ED Provider Notes (Addendum)
[facility]-EMERGENCY DEPT Provider Note   CSN: [id number] Arrival date & time: 04/04/17  1840     History   Chief Complaint Chief Complaint  Patient presents with  . Allergic Reaction    HPI [name] is a 47 y.o. female.  The history is provided by the patient and a relative. The history is limited by a language barrier. A language interpreter was used.  Allergic Reaction  Presenting symptoms: itching and rash   Presenting symptoms: no wheezing   Presenting symptoms comment:  Feels like there is something in her throat Severity:  Severe Duration:  4 hours Prior allergic episodes:  No prior episodes Context comment:  Recently dx with the flu yesterday and given steroid shot, azithromycin and xofluza Relieved by:  Nothing Worsened by:  Nothing Ineffective treatments:  Antihistamines (took benadryl shortly after sx started but did not help)   Past Medical History:  Diagnosis Date  . Hypertension     Patient Active Problem List   Diagnosis Date Noted  . Upper back pain 07/11/2014  . Essential hypertension 03/26/2010  . HEADACHE, CHRONIC 03/26/2010    Past Surgical History:  Procedure Laterality Date  . CESAREAN SECTION    . TUBAL LIGATION      OB History    Gravida Para Term Preterm AB Living   4 4 4  0 0 3   SAB TAB Ectopic Multiple Live Births   0 0 0 0         Home Medications    Prior to Admission medications   Medication Sig Start Date End Date Taking? Authorizing Provider  lisinopril-hydrochlorothiazide (PRINZIDE,ZESTORETIC) 10-12.5 MG tablet Take 1 tablet by mouth daily. 02/18/17   Wallis Bamberg, PA-C  meloxicam (MOBIC) 7.5 MG tablet Take 1 tablet (7.5 mg total) by mouth daily. 02/18/17   Wallis Bamberg, PA-C    Family History History reviewed. No pertinent family history.  Social History Social History   Tobacco Use  . Smoking status: Never Smoker  . Smokeless tobacco: Never Used  Substance Use Topics  . Alcohol use: No   Alcohol/week: 0.0 oz  . Drug use: No     Allergies   Patient has no known allergies.   Review of Systems Review of Systems  Respiratory: Negative for wheezing.   Skin: Positive for itching and rash.  All other systems reviewed and are negative.    Physical Exam Updated Vital Signs BP 109/74 (BP Location: Left Arm)   Pulse 70   Temp 99 F (37.2 C) (Oral)   Resp 18   Wt 67.1 kg (148 lb)   LMP 03/05/2017 (Exact Date)   SpO2 97%   BMI 30.41 kg/m   Physical Exam  Constitutional: She is oriented to person, place, and time. She appears well-developed and well-nourished. No distress.  HENT:  Head: Normocephalic and atraumatic.  Mouth/Throat: Oropharynx is clear and moist.  No stridor  Eyes: Conjunctivae and EOM are normal. Pupils are equal, round, and reactive to light.  Neck: Normal range of motion. Neck supple.  Cardiovascular: Normal rate, regular rhythm and intact distal pulses.  No murmur heard. Pulmonary/Chest: Effort normal and breath sounds normal. No respiratory distress. She has no wheezes. She has no rales.  Abdominal: Soft. She exhibits no distension. There is no tenderness. There is no rebound and no guarding.  Musculoskeletal: Normal range of motion. She exhibits no edema or tenderness.  Neurological: She is alert and oriented to person, place, and time.  Skin: Skin  is warm and dry. Rash noted. Rash is urticarial. No erythema.  Rash is mostly concentrated over the face, neck and trunk  Psychiatric: She has a normal mood and affect. Her behavior is normal.  Nursing note and vitals reviewed.    ED Treatments / Results  Labs (all labs ordered are listed, but only abnormal results are displayed) Labs Reviewed - No data to display  EKG  EKG Interpretation  Date/Time:  Monday April 05 2017 00:08:52 EST Ventricular Rate:  75 PR Interval:    QRS Duration: 89 QT Interval:  357 QTC Calculation: 399 R Axis:   86 Text Interpretation:  Sinus rhythm  Borderline T abnormalities, diffuse leads No previous tracing Confirmed by Gwyneth Sprout ([id number]) on 04/05/2017 12:12:14 AM       Radiology No results found.  Procedures Procedures (including critical care time)  Medications Ordered in ED Medications  famotidine (PEPCID) IVPB 20 mg premix (20 mg Intravenous New Bag/Given 04/04/17 2001)  methylPREDNISolone sodium succinate (SOLU-MEDROL) 125 mg/2 mL injection 125 mg (125 mg Intravenous Given 04/04/17 2001)  diphenhydrAMINE (BENADRYL) injection 25 mg (25 mg Intravenous Given 04/04/17 2001)     Initial Impression / Assessment and Plan / ED Course  I have reviewed the triage vital signs and the nursing notes.  Pertinent labs & imaging results that were available during my care of the patient were reviewed by me and considered in my medical decision making (see chart for details).     Patient presenting with a urticarial type rash which is most likely an allergic reaction to a new medication she is received in the last 24 hours.  Patient yesterday was seen at urgent care and diagnosed with the flu.  She took [place] but was also given steroids and azithromycin.  She took the azithromycin today as well and around 4:00 she developed a diffuse urticarial pruritic rash.  She also complains of feeling like there is a lump in her throat but no difficulty swallowing or breathing.  Patient is not stridorous or wheezing on exam.  She has no uvular or tongue swelling. Patient given IV Benadryl, Solu-Medrol and Pepcid.  No prior history of allergic reaction.  9:11 PM Pt has had no improvement with Benadryl, Solu-Medrol and Pepcid.  She is developing worsening rash of the hands and still has a sensation of something in her throat making it hard to swallow.  Patient given EpiPen.  11:25 PM Mild improvement after epi but at 2 hours pt's itching and rash has gotten worse and itching is worsening.  Pt did have some improvement of globus sensation in the  throat.  Will give vistaril.  Will admit due to persistent and worsening sx.  CRITICAL CARE Performed by: Luciann Gossett Total critical care time: 30 minutes Critical care time was exclusive of separately billable procedures and treating other patients. Critical care was necessary to treat or prevent imminent or life-threatening deterioration. Critical care was time spent personally by me on the following activities: development of treatment plan with patient and/or surrogate as well as nursing, discussions with consultants, evaluation of patient's response to treatment, examination of patient, obtaining history from patient or surrogate, ordering and performing treatments and interventions, ordering and review of laboratory studies, ordering and review of radiographic studies, pulse oximetry and re-evaluation of patient's condition.   Final Clinical Impressions(s) / ED Diagnoses   Final diagnoses:  Allergic reaction, initial encounter    ED Discharge Orders    None  Gwyneth SproutPlunkett, Kol Consuegra, MD 04/04/17 [id number]    Gwyneth SproutPlunkett, Maleik Vanderzee, MD 04/04/17 [id number]    Gwyneth SproutPlunkett, Delfina Schreurs, MD 04/05/17 [id number]

## 2017-04-04 NOTE — ED Triage Notes (Signed)
Pt was diagnosed with the flu yesterday and was given a steroid shot and an antibiotic shot according to family  Pt was given 2 pills and given zithromax and zofran  Pt states today she started having a rash with itching and states like her throat is got something stuck in it making it hard to breathe  Pt has hives noted  Pt states she took 50mg  of benadryl about 45 minutes ago

## 2017-04-05 ENCOUNTER — Observation Stay (HOSPITAL_COMMUNITY): Payer: Medicaid Other

## 2017-04-05 DIAGNOSIS — I10 Essential (primary) hypertension: Secondary | ICD-10-CM

## 2017-04-05 DIAGNOSIS — T7840XA Allergy, unspecified, initial encounter: Secondary | ICD-10-CM

## 2017-04-05 DIAGNOSIS — L509 Urticaria, unspecified: Secondary | ICD-10-CM

## 2017-04-05 DIAGNOSIS — J111 Influenza due to unidentified influenza virus with other respiratory manifestations: Secondary | ICD-10-CM

## 2017-04-05 LAB — CBC WITH DIFFERENTIAL/PLATELET
BASOS PCT: 0 %
Basophils Absolute: 0 10*3/uL (ref 0.0–0.1)
EOS ABS: 0 10*3/uL (ref 0.0–0.7)
EOS PCT: 0 %
HCT: 37.1 % (ref 36.0–46.0)
HEMOGLOBIN: 12.5 g/dL (ref 12.0–15.0)
LYMPHS ABS: 0.8 10*3/uL (ref 0.7–4.0)
Lymphocytes Relative: 9 %
MCH: 30.3 pg (ref 26.0–34.0)
MCHC: 33.7 g/dL (ref 30.0–36.0)
MCV: 90 fL (ref 78.0–100.0)
Monocytes Absolute: 0.3 10*3/uL (ref 0.1–1.0)
Monocytes Relative: 3 %
NEUTROS PCT: 88 %
Neutro Abs: 8 10*3/uL — ABNORMAL HIGH (ref 1.7–7.7)
PLATELETS: 228 10*3/uL (ref 150–400)
RBC: 4.12 MIL/uL (ref 3.87–5.11)
RDW: 13 % (ref 11.5–15.5)
WBC: 9.2 10*3/uL (ref 4.0–10.5)

## 2017-04-05 MED ORDER — DIPHENHYDRAMINE HCL 25 MG PO CAPS: 25.0000 mg | ORAL_CAPSULE | Freq: Four times a day (QID) | ORAL | 0 refills | Status: DC | PRN

## 2017-04-05 MED ORDER — HYDROXYZINE HCL 25 MG PO TABS: 50.0000 mg | ORAL_TABLET | Freq: Two times a day (BID) | ORAL | Status: DC | PRN

## 2017-04-05 MED ORDER — FAMOTIDINE 20 MG IN NS 100 ML IVPB: 20.0000 mg | Freq: Once | INTRAVENOUS | Status: DC

## 2017-04-05 MED ORDER — SODIUM CHLORIDE 0.9 % IV SOLN
INTRAVENOUS | Status: DC
  Administered 2017-04-05: 02:00:00 via INTRAVENOUS

## 2017-04-05 MED ORDER — POTASSIUM CHLORIDE CRYS ER 20 MEQ PO TBCR
20.0000 meq | EXTENDED_RELEASE_TABLET | Freq: Once | ORAL | Status: AC
  Administered 2017-04-05: 20 meq via ORAL
  Filled 2017-04-05: qty 1

## 2017-04-05 MED ORDER — ONDANSETRON HCL 4 MG/2ML IJ SOLN: 4.0000 mg | Freq: Four times a day (QID) | INTRAMUSCULAR | Status: DC | PRN

## 2017-04-05 MED ORDER — HYDROXYZINE HCL 50 MG PO TABS: 50.0000 mg | ORAL_TABLET | Freq: Two times a day (BID) | ORAL | 0 refills | Status: DC | PRN

## 2017-04-05 MED ORDER — LORATADINE 10 MG PO TABS
10.0000 mg | ORAL_TABLET | Freq: Every day | ORAL | Status: DC
  Administered 2017-04-05: 10 mg via ORAL
  Filled 2017-04-05: qty 1

## 2017-04-05 MED ORDER — PREDNISONE 20 MG PO TABS
40.0000 mg | ORAL_TABLET | Freq: Every day | ORAL | Status: DC
  Administered 2017-04-05: 40 mg via ORAL
  Filled 2017-04-05: qty 2

## 2017-04-05 MED ORDER — PREDNISONE 10 MG PO TABS: ORAL_TABLET | ORAL | 0 refills | Status: DC

## 2017-04-05 MED ORDER — ENOXAPARIN SODIUM 40 MG/0.4ML ~~LOC~~ SOLN
40.0000 mg | Freq: Every day | SUBCUTANEOUS | Status: DC
  Administered 2017-04-05: 40 mg via SUBCUTANEOUS
  Filled 2017-04-05: qty 0.4

## 2017-04-05 MED ORDER — LORATADINE 10 MG PO TABS: 10.0000 mg | ORAL_TABLET | Freq: Every day | ORAL | 0 refills | Status: DC

## 2017-04-05 MED ORDER — FAMOTIDINE 20 MG PO TABS
20.0000 mg | ORAL_TABLET | Freq: Two times a day (BID) | ORAL | Status: DC
  Administered 2017-04-05: 20 mg via ORAL
  Filled 2017-04-05 (×2): qty 1

## 2017-04-05 MED ORDER — ACETAMINOPHEN 650 MG RE SUPP: 650.0000 mg | Freq: Four times a day (QID) | RECTAL | Status: DC | PRN

## 2017-04-05 MED ORDER — FAMOTIDINE 20 MG PO TABS: 20.0000 mg | ORAL_TABLET | Freq: Two times a day (BID) | ORAL | 0 refills | Status: DC

## 2017-04-05 MED ORDER — ACETAMINOPHEN 325 MG PO TABS: 650.0000 mg | ORAL_TABLET | Freq: Four times a day (QID) | ORAL | Status: DC | PRN

## 2017-04-05 MED ORDER — EPINEPHRINE 0.3 MG/0.3ML IJ SOAJ: 0.3000 mg | Freq: Once | INTRAMUSCULAR | 0 refills | Status: AC

## 2017-04-05 MED ORDER — ONDANSETRON HCL 4 MG PO TABS: 4.0000 mg | ORAL_TABLET | Freq: Four times a day (QID) | ORAL | Status: DC | PRN

## 2017-04-05 MED ORDER — SODIUM CHLORIDE 0.9% FLUSH
3.0000 mL | Freq: Two times a day (BID) | INTRAVENOUS | Status: DC
  Administered 2017-04-05: 3 mL via INTRAVENOUS

## 2017-04-05 MED ORDER — IPRATROPIUM-ALBUTEROL 0.5-2.5 (3) MG/3ML IN SOLN: 3.0000 mL | RESPIRATORY_TRACT | Status: DC | PRN

## 2017-04-05 NOTE — H&P (Signed)
History and Physical    [name] ZOX:[id number] DOB: Oct 08, 1970 DOA: 04/04/2017  Referring MD/NP/PA: Dr. Gwyneth SproutWhitney Plunkett PCP: Patient, No Pcp Per  Patient coming from: Home  Chief Complaint: Hives  I have personally briefly reviewed patient's old medical records in [facility]   HPI: [name] is a 47 y.o. Rade speaking female with medical history significant of HTN, who presents with complaints of developing itchy rash over her whole body starting yesterday afternoon.  Patient had just been diagnosed with influenza on 2/16, at [facility] urgent care.  Patient reports being given 2 pills to take for treatment of flu thought possibly to be Xofluza, IV antibiotics of azithromycin, and one-time dose of steroids IV.  She noted no issue until yesterday around 4 PM after taking her second dose of azithromycin breaking out into a rash and subsequently reports playing feeling like there was a lump in her throat.  Associated symptoms include palpitations.  Relates symptoms of having an intermittent cough to the flu, and has not noticed any change in severity.  Denies any difficulty clearing secretions, wheezing, facial/tongue swelling, difficulty breathing, or similar symptoms in the past.  The patient states that she has not required antibiotics previously in the past.  ED Course: Upon admission into the emergency department patient was noted to be afebrile, pulse 70-75, respirations 18, blood pressure 105/61-112/52, O2 saturations 97-100% on room air.  The patient had been evaluated given 25 mg Benadryl, 125 mg of Solu-Medrol, and 20 mg of Pepcid IV.  On reevaluation patient did not appear to be improving and was given 0.3 mg of epinephrine and subsequently 50 mg of hydroxyzine orally with some improvement of symptoms.  TRH called to admit.     Review of Systems  Constitutional: Positive for malaise/fatigue. Negative for weight loss.  HENT: Negative for ear discharge and ear pain.       Positive for globus sensation  Eyes: Negative for photophobia and pain.  Respiratory: Positive for cough. Negative for shortness of breath and wheezing.   Cardiovascular: Negative for chest pain and leg swelling.  Gastrointestinal: Negative for abdominal pain, nausea and vomiting.  Genitourinary: Negative for dysuria, hematuria and urgency.  Musculoskeletal: Negative for falls and neck pain.  Skin: Positive for itching and rash.  Neurological: Negative for focal weakness, loss of consciousness, weakness and headaches.  Psychiatric/Behavioral: Negative for memory loss and substance abuse.    Past Medical History:  Diagnosis Date  . Hypertension     Past Surgical History:  Procedure Laterality Date  . CESAREAN SECTION    . TUBAL LIGATION       reports that  has never smoked. she has never used smokeless tobacco. She reports that she does not drink alcohol or use drugs.  No Known Allergies  History reviewed. No pertinent family history heart disease.  Prior to Admission medications   Medication Sig Start Date End Date Taking? Authorizing Provider  azithromycin (ZITHROMAX) 250 MG tablet Take 250 mg by mouth daily.   Yes [provider]  diphenhydrAMINE (BENADRYL) 25 mg capsule Take 50 mg by mouth once.   Yes [provider]  lisinopril-hydrochlorothiazide (PRINZIDE,ZESTORETIC) 10-12.5 MG tablet Take 1 tablet by mouth daily. 02/18/17  Yes Wallis BambergMani, Mario, PA-C  meloxicam (MOBIC) 7.5 MG tablet Take 1 tablet (7.5 mg total) by mouth daily. 02/18/17  Yes Wallis BambergMani, Mario, PA-C  ondansetron (ZOFRAN) 4 MG tablet Take 4 mg by mouth 2 (two) times daily as needed for nausea or vomiting.  Yes [provider]  UNKNOWN TO PATIENT Take 2 tablets by mouth once. Pt is unsure of what this medication was given to her at the [facility] Urgent care yesterday - Daughter thinks it was a flu medication (was a 2 tablet one time regimen) but is unable to verify what medication it was.   Yes  [provider]    Physical Exam:  Constitutional: NAD, calm, comfortable Vitals:   04/04/17 1910 04/04/17 2143 04/05/17 0004  BP: 109/74 (!) 112/56 105/61  Pulse: 70 75 73  Resp: 18 18 18   Temp: 99 F (37.2 C)    TempSrc: Oral    SpO2: 97% 98% 100%  Weight: 67.1 kg (148 lb)     Eyes: PERRL, lids and conjunctivae normal ENMT: Mucous membranes are moist. Posterior pharynx clear of any exudate or lesions. Normal dentition.  No swelling of tongue or oral mucosa. Neck: normal, supple, no masses, no thyromegaly.  No stridor Respiratory: clear to auscultation bilaterally, no wheezing, no crackles. Normal respiratory effort. No accessory muscle use.  Cardiovascular: Regular rate and rhythm, no murmurs / rubs / gallops. No extremity edema. 2+ pedal pulses. No carotid bruits.  Abdomen: no tenderness, no masses palpated. No hepatosplenomegaly. Bowel sounds positive.  Musculoskeletal: no clubbing / cyanosis. No joint deformity upper and lower extremities. Good ROM, no contractures. Normal muscle tone.  Skin: Urticarial rash present of the face, neck, upper, and lower trunk.  No open wounds present Neurologic: CN 2-12 grossly intact. Sensation intact, DTR normal. Strength 5/5 in all 4.  Psychiatric: Normal judgment and insight. Alert and oriented x 3. Normal mood.     Labs on Admission: I have personally reviewed following labs and imaging studies  CBC: Recent Labs  Lab 04/04/17 2342 04/04/17 2357  WBC 9.2  --   NEUTROABS 8.0*  --   HGB 12.5 12.9  HCT 37.1 38.0  MCV 90.0  --   PLT 228  --    Basic Metabolic Panel: Recent Labs  Lab 04/04/17 2357  NA 140  K 3.4*  CL 104  GLUCOSE 162*  BUN 15  CREATININE 0.70   GFR: Estimated Creatinine Clearance: 73 mL/min (by C-G formula based on SCr of 0.7 mg/dL). Liver Function Tests: No results for input(s): AST, ALT, ALKPHOS, BILITOT, PROT, ALBUMIN in the last 168 hours. No results for input(s): LIPASE, AMYLASE in the last  168 hours. No results for input(s): AMMONIA in the last 168 hours. Coagulation Profile: No results for input(s): INR, PROTIME in the last 168 hours. Cardiac Enzymes: No results for input(s): CKTOTAL, CKMB, CKMBINDEX, TROPONINI in the last 168 hours. BNP (last 3 results) No results for input(s): PROBNP in the last 8760 hours. HbA1C: No results for input(s): HGBA1C in the last 72 hours. CBG: No results for input(s): GLUCAP in the last 168 hours. Lipid Profile: No results for input(s): CHOL, HDL, LDLCALC, TRIG, CHOLHDL, LDLDIRECT in the last 72 hours. Thyroid Function Tests: No results for input(s): TSH, T4TOTAL, FREET4, T3FREE, THYROIDAB in the last 72 hours. Anemia Panel: No results for input(s): VITAMINB12, FOLATE, FERRITIN, TIBC, IRON, RETICCTPCT in the last 72 hours. Urine analysis:    Component Value Date/Time   COLORURINE STRAW (A) 05/01/2013 1006   APPEARANCEUR HAZY (A) 05/01/2013 1006   LABSPEC 1.011 05/01/2013 1006   PHURINE 6.0 05/01/2013 1006   GLUCOSEU NEGATIVE 05/01/2013 1006   HGBUR NEGATIVE 05/01/2013 1006   BILIRUBINUR negative 10/07/2016 1429   KETONESUR negative 10/07/2016 1429   KETONESUR NEGATIVE 05/01/2013  1006   PROTEINUR negative 10/07/2016 1429   PROTEINUR NEGATIVE 05/01/2013 1006   UROBILINOGEN 0.2 10/07/2016 1429   UROBILINOGEN 0.2 05/01/2013 1006   NITRITE Negative 10/07/2016 1429   NITRITE NEGATIVE 05/01/2013 1006   LEUKOCYTESUR Negative 10/07/2016 1429   Sepsis Labs: No results found for this or any previous visit (from the past 240 hour(s)).   Radiological Exams on Admission: No results found.  Chest x-ray: Independently reviewed.  No acute disease  Assessment/Plan Urticaria, suspected allergic reaction: Acute.  Patient reports breaking out in a whole body rash that is very itchy after recently being diagnosed with influenza and globus sensation.  Possible inciting medications include azithromycin, possible influenza medication of Xofluza,  and less likely hydrochlorothiazide/lisinopril.  Patient was given Benadryl, Solu-Medrol, Pepcid, 1 dose of epinephrine, and later hydroxyzine while in the ED. - Admit to MedSurg bed - IV fluids normal saline at 100 mL/h - Continue Pepcid 20 mg po twice daily,  - Claritin 10 mg po daily/twice daily as less sedating - Hydroxyzine prn if needed severe itching - Prednisone 40 mg daily - EpiPen as needed for signs of respiratory distress/airway compromise  - May benefit from referral to allergist at discharge  Recently diagnosed with influenza: Patient reportedly took one-time medication to treat for influenza that thought to be [place].  Hypokalemia: Acute.  Initial potassium 3.4 and is mildly low, but note patient on lisinopril/hydrochlorothiazide.   - Give 20 mEq of potassium chloride x1 dose - Restart blood pressure medication when and if medically appropriate   DVT prophylaxis: Lovenox   Code Status: Full Family Communication: Discussed plan of care with the patient and family present at bedside Disposition Plan: Likely discharge home in 1-2 days symptoms resolved. Consults called:  none Admission status: Observation  Clydie Braun MD [facility] Pager [phone]   If 7PM-7AM, please contact night-coverage [email or web address] Password [facility]  04/05/2017, 12:25 AM

## 2017-04-05 NOTE — ED Notes (Signed)
ED TO INPATIENT HANDOFF REPORT  Name/Age/Gender [name] 47 y.o. female  Code Status    Code Status Orders  (From admission, onward)        Start     Ordered   04/05/17 0030  Full code  Continuous     04/05/17 0035    Code Status History    Date Active Date Inactive Code Status Order ID Comments User Context   This patient has a current code status but no historical code status.      Home/SNF/Other Home  Chief Complaint Allergic Reaction to Medication   Level of Care/Admitting Diagnosis ED Disposition    ED Disposition Condition Comment   Admit  Hospital Area: [facility] [[id number]]  Level of Care: Med-Surg [16]  Diagnosis: Allergic reaction [208270]  Admitting Physician: Clydie Braun [[id number]]  Attending Physician: Clydie Braun [[id number]]  PT Class (Do Not Modify): Observation [104]  PT Acc Code (Do Not Modify): Observation [10022]       Medical History Past Medical History:  Diagnosis Date  . Hypertension     Allergies No Known Allergies  IV Location/Drains/Wounds Patient Lines/Drains/Airways Status   Active Line/Drains/Airways    Name:   Placement date:   Placement time:   Site:   Days:   Peripheral IV 04/04/17 Left;Posterior Wrist   04/04/17    2000    Wrist   1          Labs/Imaging Results for orders placed or performed during the hospital encounter of 04/04/17 (from the past 48 hour(s))  CBC with Differential/Platelet     Status: Abnormal   Collection Time: 04/04/17 11:42 PM  Result Value Ref Range   WBC 9.2 4.0 - 10.5 K/uL   RBC 4.12 3.87 - 5.11 MIL/uL   Hemoglobin 12.5 12.0 - 15.0 g/dL   HCT 19.1 47.8 - 29.5 %   MCV 90.0 78.0 - 100.0 fL   MCH 30.3 26.0 - 34.0 pg   MCHC 33.7 30.0 - 36.0 g/dL   RDW 62.1 30.8 - 65.7 %   Platelets 228 150 - 400 K/uL   Neutrophils Relative % 88 %   Neutro Abs 8.0 (H) 1.7 - 7.7 K/uL   Lymphocytes Relative 9 %   Lymphs Abs 0.8 0.7 - 4.0 K/uL   Monocytes Relative 3 %   Monocytes Absolute 0.3 0.1 - 1.0 K/uL   Eosinophils Relative 0 %   Eosinophils Absolute 0.0 0.0 - 0.7 K/uL   Basophils Relative 0 %   Basophils Absolute 0.0 0.0 - 0.1 K/uL    Comment: Performed at [facility], [address], [place], [place] [address]  I-stat chem 8, ed     Status: Abnormal   Collection Time: 04/04/17 11:57 PM  Result Value Ref Range   Sodium 140 135 - 145 mmol/L   Potassium 3.4 (L) 3.5 - 5.1 mmol/L   Chloride 104 101 - 111 mmol/L   BUN 15 6 - 20 mg/dL   Creatinine, Ser 2.95 0.44 - 1.00 mg/dL   Glucose, Bld 284 (H) 65 - 99 mg/dL   Calcium, Ion 1.32 (L) 1.15 - 1.40 mmol/L   TCO2 22 22 - 32 mmol/L   Hemoglobin 12.9 12.0 - 15.0 g/dL   HCT 44.0 10.2 - 72.5 %   No results found.  Pending Labs Unresulted Labs (From admission, onward)   Start     Ordered   04/05/17 0032  HIV antibody (Routine Testing)  Add-on,  R     04/05/17 0035      Vitals/Pain Today's Vitals   04/05/17 0004 04/05/17 0030 04/05/17 0045 04/05/17 0100  BP: 105/61 116/71  (!) 101/58  Pulse: 73 73 72   Resp: 18 16 (!) 23 (!) 23  Temp:      TempSrc:      SpO2: 100% 91% 97%   Weight:      PainSc:        Isolation Precautions No active isolations  Medications Medications  enoxaparin (LOVENOX) injection 40 mg (not administered)  sodium chloride flush (NS) 0.9 % injection 3 mL (3 mLs Intravenous Given 04/05/17 0049)  ipratropium-albuterol (DUONEB) 0.5-2.5 (3) MG/3ML nebulizer solution 3 mL (not administered)  potassium chloride SA (K-DUR,KLOR-CON) CR tablet 20 mEq (not administered)  0.9 %  sodium chloride infusion (not administered)  acetaminophen (TYLENOL) tablet 650 mg (not administered)    Or  acetaminophen (TYLENOL) suppository 650 mg (not administered)  ondansetron (ZOFRAN) tablet 4 mg (not administered)    Or  ondansetron (ZOFRAN) injection 4 mg (not administered)  methylPREDNISolone sodium succinate (SOLU-MEDROL) 125 mg/2 mL injection 125 mg (125 mg  Intravenous Given 04/04/17 2001)  diphenhydrAMINE (BENADRYL) injection 25 mg (25 mg Intravenous Given 04/04/17 2001)  famotidine (PEPCID) IVPB 20 mg premix (0 mg Intravenous Stopped 04/04/17 2031)  EPINEPHrine (EPI-PEN) injection 0.3 mg (0.3 mg Intramuscular Given 04/04/17 2115)  hydrOXYzine (ATARAX/VISTARIL) tablet 50 mg (50 mg Oral Given 04/04/17 2340)    Mobility walks

## 2017-04-05 NOTE — Discharge Summary (Signed)
Physician Discharge Summary  [name] ZOX:[id number] DOB: [birth year] DOA: 04/04/2017  PCP: Patient, No Pcp Per  Admit date: 04/04/2017 Discharge date: 04/05/2017   Recommendations for Outpatient Follow-Up:   1. ?referral to allergy if continues-- not sure what caused reaction   Discharge Diagnosis:   Principal Problem:   Urticaria Active Problems:   Essential hypertension   Allergic reaction   Influenza   Discharge disposition:  Home.  Discharge Condition: Improved.  Diet recommendation: Regular.  Wound care: None.   History of Present Illness:   [name] is a 47 y.o. Rade speaking female with medical history significant of HTN, who presents with complaints of developing itchy rash over her whole body starting yesterday afternoon.  Patient had just been diagnosed with influenza on 2/16, at [facility] urgent care.  Patient reports being given 2 pills to take for treatment of flu thought possibly to be Xofluza, IV antibiotics of azithromycin, and one-time dose of steroids IV.  She noted no issue until yesterday around 4 PM after taking her second dose of azithromycin breaking out into a rash and subsequently reports playing feeling like there was a lump in her throat.  Associated symptoms include palpitations.  Relates symptoms of having an intermittent cough to the flu, and has not noticed any change in severity.  Denies any difficulty clearing secretions, wheezing, facial/tongue swelling, difficulty breathing, or similar symptoms in the past.  The patient states that she has not required antibiotics previously in the past.     Hospital Course by Problem:   Urticaria, suspected allergic reaction: Acute.  -resolved -unsure cause -prednisone/benadryl/pepcid/claratin -outpatient referral to allergy if needed  Recently diagnosed with influenza: Patient reportedly took one-time medication to treat for influenza that thought to be Xofluza.  Hypokalemia:    -replete  HTN -hold home meds -BP normal     Medical Consultants:    None.   Discharge Exam:   Vitals:   04/05/17 0100 04/05/17 0211  BP: (!) 101/58 (!) 110/59  Pulse:  71  Resp: (!) 23   Temp:  98.2 F (36.8 C)  SpO2:  99%   Vitals:   04/05/17 0030 04/05/17 0045 04/05/17 0100 04/05/17 0211  BP: 116/71  (!) 101/58 (!) 110/59  Pulse: 73 72  71  Resp: 16 (!) 23 (!) 23   Temp:    98.2 F (36.8 C)  TempSrc:    Oral  SpO2: 91% 97%  99%  Weight:        Gen:  NAD    The results of significant diagnostics from this hospitalization (including imaging, microbiology, ancillary and laboratory) are listed below for reference.     Procedures and Diagnostic Studies:   Dg Chest Port 1 View  Result Date: 04/05/2017 CLINICAL DATA:  Allergic reaction EXAM: PORTABLE CHEST 1 VIEW COMPARISON:  02/04/2010 FINDINGS: The heart size and mediastinal contours are within normal limits. Both lungs are clear. The visualized skeletal structures are unremarkable. IMPRESSION: No active disease. Electronically Signed   By: Jasmine Pang M.D.   On: 04/05/2017 01:24     Labs:   Basic Metabolic Panel: Recent Labs  Lab 04/04/17 2357  NA 140  K 3.4*  CL 104  GLUCOSE 162*  BUN 15  CREATININE 0.70   GFR Estimated Creatinine Clearance: 73 mL/min (by C-G formula based on SCr of 0.7 mg/dL). Liver Function Tests: No results for input(s): AST, ALT, ALKPHOS, BILITOT, PROT, ALBUMIN in the last 168 hours. No results for input(s): LIPASE,  AMYLASE in the last 168 hours. No results for input(s): AMMONIA in the last 168 hours. Coagulation profile No results for input(s): INR, PROTIME in the last 168 hours.  CBC: Recent Labs  Lab 04/04/17 2342 04/04/17 2357  WBC 9.2  --   NEUTROABS 8.0*  --   HGB 12.5 12.9  HCT 37.1 38.0  MCV 90.0  --   PLT 228  --    Cardiac Enzymes: No results for input(s): CKTOTAL, CKMB, CKMBINDEX, TROPONINI in the last 168 hours. BNP: Invalid input(s):  POCBNP CBG: No results for input(s): GLUCAP in the last 168 hours. D-Dimer No results for input(s): DDIMER in the last 72 hours. Hgb A1c No results for input(s): HGBA1C in the last 72 hours. Lipid Profile No results for input(s): CHOL, HDL, LDLCALC, TRIG, CHOLHDL, LDLDIRECT in the last 72 hours. Thyroid function studies No results for input(s): TSH, T4TOTAL, T3FREE, THYROIDAB in the last 72 hours.  Invalid input(s): FREET3 Anemia work up No results for input(s): VITAMINB12, FOLATE, FERRITIN, TIBC, IRON, RETICCTPCT in the last 72 hours. Microbiology No results found for this or any previous visit (from the past 240 hour(s)).   Discharge Instructions:   Discharge Instructions    Diet - low sodium heart healthy   Complete by:  As directed    Increase activity slowly   Complete by:  As directed      Allergies as of 04/05/2017   No Known Allergies     Medication List    STOP taking these medications   azithromycin 250 MG tablet Commonly known as:  ZITHROMAX   lisinopril-hydrochlorothiazide 10-12.5 MG tablet Commonly known as:  PRINZIDE,ZESTORETIC   meloxicam 7.5 MG tablet Commonly known as:  MOBIC   UNKNOWN TO PATIENT     TAKE these medications   diphenhydrAMINE 25 mg capsule Commonly known as:  BENADRYL Take 1 capsule (25 mg total) by mouth every 6 (six) hours as needed for itching or allergies. What changed:    how much to take  when to take this  reasons to take this   EPINEPHrine 0.3 mg/0.3 mL Soaj injection Commonly known as:  EPIPEN 2-PAK Inject 0.3 mLs (0.3 mg total) into the muscle once for 1 dose. For allergic reaction   famotidine 20 MG tablet Commonly known as:  PEPCID Take 1 tablet (20 mg total) by mouth 2 (two) times daily.   hydrOXYzine 50 MG tablet Commonly known as:  ATARAX/VISTARIL Take 1 tablet (50 mg total) by mouth 2 (two) times daily as needed (Severe itching).   loratadine 10 MG tablet Commonly known as:  CLARITIN Take 1 tablet  (10 mg total) by mouth daily. Start taking on:  04/06/2017   ondansetron 4 MG tablet Commonly known as:  ZOFRAN Take 4 mg by mouth 2 (two) times daily as needed for nausea or vomiting.   predniSONE 10 MG tablet Commonly known as:  DELTASONE 40 mg x 1 day then 30 mg x 1 day then 20 mg x 1 day then 10 mg x 1 day then stop         Time coordinating discharge: 35 min  Signed:  Joseph ArtJessica U Raechal Raben   [facility] 04/05/2017, 11:39 AM

## 2017-04-08 LAB — HIV 1/2 AB DIFFERENTIATION
HIV 1 AB: UNDETERMINED
HIV 2 Ab: NEGATIVE

## 2017-04-08 LAB — RNA QUALITATIVE

## 2017-04-08 LAB — HIV ANTIBODY (ROUTINE TESTING W REFLEX): HIV Screen 4th Generation wRfx: REACTIVE — AB

## 2017-05-15 ENCOUNTER — Other Ambulatory Visit: Payer: Self-pay | Admitting: Urgent Care

## 2017-08-10 ENCOUNTER — Other Ambulatory Visit: Payer: Self-pay | Admitting: Urgent Care

## 2017-08-10 DIAGNOSIS — I10 Essential (primary) hypertension: Secondary | ICD-10-CM

## 2017-08-18 ENCOUNTER — Encounter: Payer: Self-pay | Admitting: Urgent Care

## 2017-08-18 ENCOUNTER — Ambulatory Visit (INDEPENDENT_AMBULATORY_CARE_PROVIDER_SITE_OTHER): Payer: Self-pay | Admitting: Urgent Care

## 2017-08-18 VITALS — BP 153/74 | HR 57 | Temp 98.5°F | Resp 17 | Ht 58.5 in | Wt 144.0 lb

## 2017-08-18 DIAGNOSIS — G44229 Chronic tension-type headache, not intractable: Secondary | ICD-10-CM

## 2017-08-18 DIAGNOSIS — I10 Essential (primary) hypertension: Secondary | ICD-10-CM

## 2017-08-18 DIAGNOSIS — R030 Elevated blood-pressure reading, without diagnosis of hypertension: Secondary | ICD-10-CM

## 2017-08-18 DIAGNOSIS — M542 Cervicalgia: Secondary | ICD-10-CM

## 2017-08-18 MED ORDER — MELOXICAM 7.5 MG PO TABS: 7.5000 mg | ORAL_TABLET | Freq: Every day | ORAL | 2 refills | Status: DC

## 2017-08-18 MED ORDER — CYCLOBENZAPRINE HCL 10 MG PO TABS: 5.0000 mg | ORAL_TABLET | Freq: Every day | ORAL | 5 refills | Status: DC

## 2017-08-18 MED ORDER — LISINOPRIL-HYDROCHLOROTHIAZIDE 10-12.5 MG PO TABS: 1.0000 | ORAL_TABLET | Freq: Every day | ORAL | 3 refills | Status: DC

## 2017-08-18 NOTE — Progress Notes (Signed)
    MRN: [id number] DOB: [birth year]  Subjective:   [name] is a 47 y.o. female presenting for follow up on Hypertension. Currently managed with lis-HCTZ. Avoids salt in diet. Reports that has intermittent headaches, neck tightness and tension. Tries to hydrate well.  Denies dizziness, chest pain, shortness of breath, heart racing, palpitations, nausea, vomiting, abdominal pain, hematuria, lower leg swelling. Denies smoking cigarettes or drinking alcohol. Of note, patient has not been sleeping well again, her sister just had a baby. Patient has been helping her sister with child care.   [name] has a current medication list which includes the following prescription(s): lisinopril-hydrochlorothiazide and ondansetron. Also has No Known Allergies.  [name]  has a past medical history of Hypertension. Also  has a past surgical history that includes Cesarean section and Tubal ligation.  Objective:   Vitals: BP (!) 153/74   Pulse (!) 57   Temp 98.5 F (36.9 C) (Oral)   Resp 17   Ht 4' 10.5" (1.486 m)   Wt 144 lb (65.3 kg)   LMP 08/03/2017 (Approximate)   SpO2 98%   BMI 29.58 kg/m   BP Readings from Last 3 Encounters:  08/18/17 (!) 153/74  04/05/17 (!) 116/54  02/18/17 128/71   Physical Exam  Constitutional: She is oriented to person, place, and time. She appears well-developed and well-nourished.  HENT:  Mouth/Throat: Oropharynx is clear and moist.  Eyes: Pupils are equal, round, and reactive to light. EOM are normal. No scleral icterus.  Cardiovascular: Normal rate, regular rhythm and intact distal pulses. Exam reveals no gallop and no friction rub.  No murmur heard. Pulmonary/Chest: Effort normal and breath sounds normal. No respiratory distress. She has no wheezes. She has no rales.  Musculoskeletal: She exhibits no edema.       Cervical back: She exhibits spasm (over paraspinal muscles bilaterally). She exhibits normal range of motion, no tenderness, no bony tenderness, no swelling,  no edema and no deformity.  Patient experienced relief during exam from pressure applied to her cervical paraspinal muscles.  Neurological: She is alert and oriented to person, place, and time.  Skin: Skin is warm and dry.  Psychiatric: She has a normal mood and affect.   Assessment and Plan :   Essential hypertension - Plan: Comprehensive metabolic panel, Microalbumin / creatinine urine ratio  Elevated blood pressure reading  Chronic tension-type headache, not intractable  Neck pain  Patient ran out of her lisinopril hydrochlorothiazide, has not taken it today.  I refilled this for 1 year supply, labs pending.  Monitoring parameters given for recheck.  Otherwise follow-up in 6 months to 1 year.  Counseled patient that she needs to take better care of her self and not sign up to take on too much.  I suspect this is the source of her neck spasms and tension headaches.  I refilled her meloxicam and Flexeril as she has done well with this in the past. Follow up if symptoms persist.  Wallis BambergMario Shaquasha Gerstel, PA-C [facility] [phone] 08/18/2017  3:21 PM

## 2017-08-18 NOTE — Patient Instructions (Addendum)
Please check your blood pressure once weekly. Try to check the blood pressure around the same time each day that you check it after a period of resting in a seated position for 5-10 minutes. The readings should be between 263-335 systolic (top number), between 45-62 diastolic (bottom number). If your blood pressure falls outside this range consistently (i.e. 3-4 weeks in a row), then come back to the clinic for a recheck.     T?ng huy?t p Hypertension T?ng huy?t p, th??ng ???c g?i l huy?t p cao, l khi l?c b?m mu qua ??ng m?ch c?a qu v? qu m?nh. ??ng m?ch c?a qu v? l cc m?ch mu mang mu t? tim ?i kh?p c? th?. T?ng huy?t p khi?n tim lm vi?c v?t v? h?n ?? b?m mu v c th? khi?n cc ??ng m?ch tr? ln h?p ho?c c?ng. T?ng huy?t p khng ???c ?i?u tr? ho?c khng ki?m sot ???c c th? d?n t?i nh?i mu c? tim, ??t qu?, b?nh th?n v nh?ng v?n ?? khc. Ch? s? ?o huy?t p g?m m?t ch? s? cao trn m?t ch? s? th?p. Huy?t a?p ly? t???ng cu?a quy? vi? la? d??i 120/80. Ch? s? ??u tin ("??nh") ???c g?i l huy?t p tm thu. ?y l s? ?o p su?t trong ??ng m?ch khi tim qu v? ??p. Ch? s? th? hai ("?y") ???c g?i l huy?t p tm tr??ng. ?y l s? ?o p su?t trong ??ng m?ch khi tim qu v? ngh?Lourdes Sledge nhn g gy ra? Khng r nguyn nhn gy ra tnh tr?ng ny. ?i?u g lm t?ng nguy c?? M?t s? y?u t? nguy c? d?n ??n huy?t p cao c th? ki?m sot ???c. M?t s? y?u t? khc th khng. Nh?ng y?u t? qu v? c th? thay ??i  Ht thu?c.  B? b?nh ti?u ???ng tup 2, cholesterol cao, ho?c c? hai.  Khng t?p th? d?c ho?c cc ho?t ??ng th? ch?t ??y ??Marland Kitchen  Th?a cn.  ?n qu nhi?u ch?t bo, ???ng, ca-lo, ho?c mu?i (Natri).  U?ng qu nhi?u r??u. Nh?ng y?u t? kh ho?c khng th? thay ??i  B?nh th?n m?n tnh.  C ti?n s? gia ?nh b? cao huy?t p.  ?? tu?i. Nguy c? t?ng ln theo ?? tu?i.  Ch?ng t?c. Qu v? c th? c nguy c? cao h?n n?u qu v? l ng??i M? g?c Phi.  Gi?i tnh. Nam gi?i c nguy c? cao h?n ph? n?  tr??c tu?i 45. Sau tu?i 65, ph? n? c nguy c? cao h?n nam gi?i.  Ng?ng th? do t?c ngh?n khi ng?.  C?ng th?ng. Cc d?u hi?u ho?c tri?u ch?ng l g? Huy?t p qu cao (c?n t?ng huy?t p) c th? gy ra:  ?au ??u.  Lo u.  Kh th?.  Ch?y mu cam.  Bu?n nn v nn.  ?au ng?c n?ng.  C? ??ng gi?t gi?t qu v? khng th? ki?m sot ???c (co gi?t).  Ch?n ?on tnh tr?ng ny nh? th? no? Tnh tr?ng ny ???c ch?n ?on b?ng cch ?o huy?t p c?a qu v? lc qu v? ng?i, ?? tay trn m?t m?t ph?ng. B?ng qu?n thi?t b? ?o huy?t p s? ???c qu?n tr?c ti?pvo vng da cnh tay pha trn c?a qu v? ngang v?i m?c tim. Huy?t p c?n ???c ?o t nh?t hai l?n trn cng m?t cnh tay. M?t s? tnh tr?ng nh?t ??nh c th? lm cho huy?t p khc nhau gi?a tay ph?i v tay tri c?a qu v?. M?t s? y?u t? nh?t ??  nh c th? khi?n ch? s? ?o huy?t p th?p h?n ho?c cao h?n so v?i bnh th??ng (t?ng) trong th?i gian ng?n:  Khi huy?t p c?a qu v? ? phng khm c?a chuyn gia ch?m [place] s?c kh?e cao h?n so v?i lc qu v? ? nh, hi?n t??ng ny ???c g?i l t?ng huy?t p o chong tr?ng. H?u h?t nh?ng ng??i b? tnh tr?ng ny ??u khng c?n dng thu?c.  Khi huy?t p c?a qu v? lc ? nh cao h?n so v?i lc qu v? ? phng khm chuyn gia ch?m [place] s?c kh?e, hi?n t??ng ny ???c g?i l t?ng huy?t p m?t n?. H?u h?t nh?ng ng??i b? tnh tr?ng ny ??u c th? c?n dng thu?c ?? ki?m sot huy?t p.  N?u qu v? c ch? s? huy?t p cao trong m?t l?n khm ho?c qu v? c huy?t p bnh th??ng c km cc y?u t? nguy c? khc:  Qu v? c th? ???c yu c?u tr? l?i vo m?t ngy khc ?? ki?m tra l?i huy?t p.  Qu v? c th? ???c yu c?u theo di huy?t p t?i nh trong vng 1 tu?n ho?c lu h?n.  N?u qu v? ???c ch?n ?on b? t?ng huy?t p, qu v? c th? c?n th?c hi?n cc xt nghi?m mu ho?c ki?m tra hnh ?nh khc ?? gip chuyn gia ch?m [place] s?c kh?e hi?u nguy c? t?ng th? m?c cc b?nh tr?ng khc. Tnh tr?ng ny ???c ?i?u tr? nh? th? no? Tnh tr?ng ny ???c ?i?u  tr? b?ng cch thay ??i l?i s?ng lnh m?nh, ch?ng h?nh nh? ?n th?c ph?m c l?i cho s?c kh?e, t?p th? d?c nhi?u h?n v gi?m l??ng r??u u?ng vo. N?u thay ??i l?i s?ng khng ?? ?? ??a huy?t p v? m?c c th? ki?m sot ???c, chuyn gia ch?m  s?c kh?e c th? k ??n thu?c, v n?u:  Huy?t p tm thu c?a qu v? trn 130.  Huy?t p tm tr??ng c?a qu v? trn 80.  Huy?t p m?c tiu c nhn c?a qu v? c th? khc nhau ty thu?c v tnh tr?ng b?nh l, tu?i v cc nhn t? khc. Tun th? nh?ng h??ng d?n ny ? nh: ?n v u?ng  ?n ch? ?? giu ch?t x? v kali v t natri, ???ng ph? gia v ch?t bo. M?t k? ho?ch ?n m?u c tn ch? ?? ?n DASH (Cch ti?p c?n ?n u?ng ?? gi?m t?ng huy?t p). ?n theo cch ny: ? ?n nhi?u tri cy v rau t??i. Vo m?i b?a ?n, c? g?ng dnh m?t n?a ??a cho tri cy v rau. ? ?n ng? c?c nguyn h?t, ch?ng h?n nh? m ?ng lm t? b?t m nguyn cm, ho?c bnh m nguyn h?t. Cho ngu? c?c nguyn ca?m va?o m?t ph?n t? ??a c?a quy? vi?. ? ?n ho?c hu?ng cc s?n ph?m t? s?a t bo, ch?ng h?n nh? s?a ? b? kem ho?c s?a chua t bo. ? Trnh nh?ng mi?ng th?t nhi?u m?, th?t ? qua ch? bi?n ho?c th?t ??p mu?i v th?t gia c?m c da. Dnh kho?ng m?t ph?n t? ??a c?a qu v? cho cc protein khng m?, ch?ng h?n nh? c, th?t g khng da, ??u, tr?ng, v ??u ph?. ? Trnh nh?ng th?c ph?m ch? bi?n ho?c lm s?n. Nh?ng th?c ph?m ny th??ng c nhi?u natri, ???ng ph? gia v ch?t bo h?n.  Gi?m l??ng dng natri hng ngy c?a qu v?. H?u h?t nh?ng ng??i b? t?ng huy?t p ??u nn ?  n d??i 1.500 mg natri m?i ngy.  Gi?i h?n l??ng r??u qu v? u?ng khng qu 1 ly m?i ngy v?i ph? n? khng mang thai v 2 ly m?i ngy v?i nam gi?i. M?t ly t??ng ???ng v?i 12 ao-x? bia, 5 ao-x? r??u vang, ho?c 1 ao-x? r??u m?nh. L?i s?ng  H?p tc v?i chuyn gia ch?m [place] s?c kh?e c?a qu v? ?? duy tr tr?ng l??ng c? th? c l?i cho s?c kh?e ho?c gi?m cn. Hy h?i xem tr?ng l??ng no l l t??ng cho qu v?.  Dnh t nh?t 30 pht ?? t?p th? d?c  m c th? khi?n tim qu v? ??p nhanh h?n (t?p th? d?c nh?p ?i?u) h?u h?t cc ngy trong tu?n. Cc ho?t ??ng c th? bao g?m ?i b?, b?i, ho?c ??p xe.  Bao g?m bi t?p t?ng c??ng c? (bi t?p khng l?c), ch?ng h?n nh? bi t?p [place] ho?c nng t?, nh? m?t ph?n c?a thi quen luy?n t?p hng tu?n c?a qu v?. C? g?ng t?p nh?ng lo?i bi t?p ny trong vng 30 pht t?i thi?u 3 ngy m?t tu?n.  Khng s? d?ng b?t k? s?n ph?m no ch?a nicotine ho?c thu?c l, ch?ng ha?n nh? thu?c l d?ng ht v thu?c l ?i?n t?. N?u qu v? c?n gip ?? ?? cai thu?c, hy h?i chuyn gia ch?m [place] s?c kh?e.  Theo di huy?t p c?a qu v? t?i nh theo h??ng d?n c?a chuyn gia ch?m [place] s?c kh?e.  Tun th? t?t c? cc cu?c h?n khm l?i theo ch? d?n c?a chuyn gia ch?m [place] s?c kh?e. ?i?u ny c vai tr quan tr?ng. Thu?c  Ch? s? d?ng thu?c khng k ??n v thu?c k ??n theo ch? d?n c?a chuyn gia ch?m [place] s?c kh?e. Lm theo ch? d?n m?t cch c?n th?n. Thu?c ?i?u tr? huy?t p ph?i ???c dng theo ??n ? k.  Khng b? li?u thu?c huy?t p. B? li?u khi?n qu v? c nguy c? g?p ph?i cc v?n ?? v c th? lm cho thu?c gi?m hi?u qu?Marland Kitchen  Hy h?i chuyn gia ch?m [place] s?c kh?e c?a qu v? v? nh?ng tc d?ng ph? ho?c ph?n ?ng v?i thu?c m qu v? ph?i theo di. Hy lin l?c v?i chuyn gia ch?m  s?c kh?e n?u:  Qu v? ngh? qu v? c ph?n ?ng v?i thu?c ?ang dng.  Qu v? b? ?au ??u ti?p t?c tr? l?i (ti pht).  Qu v? c?m th?y chng m?t.  Qu v? b? s?ng ph ? m?t c chn.  Qu v? c v?n ?? v? th? l?c. Yu c?u tr? gip ngay l?p t?c n?u:  Qu v? b? ?au ??u n?ng ho?c l l?n.  Qu v? b? y?u b?t th??ng ho?c t b.  Quy? vi? ca?m th?y bi? ng?t.  Qu v? b? ?au r?t nhi?u ? ng?c ho?c b?ng.  Qu v? nn nhi?u l?n.  Qu v? b? kh th?. Tm t?t  T?ng huy?t p l khi l?c b?m mu qua ??ng m?ch c?a qu v? qu m?nh. N?u tnh tr?ng ny khng ???c ki?m sot, n c th? khi?n qu v? g?p ph?i nguy c? bi?n ch?ng nghim tr?ng.  Huy?t p m?c tiu c nhn c?a  qu v? c th? khc nhau ty thu?c v tnh tr?ng b?nh l, tu?i v cc nhn t? khc. ??i v?i h?u h?t m?i ng??i, huy?t p bnh th??ng l d??i 120/80.  ?i?u tr? t?ng huy?t p b?ng cch thay ??i l?i s?ng, dng thu?c, ho?c k?t h?p  c? hai. Thay ??i l?i s?ng bao g?m gi?m cn, ?n ch? ?? ?n c l?i cho s?c kh?e, t mu?i, t?p th? d?c nhi?u h?n v h?n ch? u?ng r??u. Thng tin ny khng nh?m m?c ?ch thay th? cho l?i khuyn m chuyn gia ch?m [place] s?c kh?e ni v?i qu v?. Hy b?o ??m qu v? ph?i th?o lu?n b?t k? v?n ?? g m qu v? c v?i chuyn gia ch?m [place] s?c kh?e c?a qu v?. Document Released: 02/02/2005 Document Revised: 01/15/2016 Document Reviewed: 01/15/2016 Elsevier Interactive Patient Education  2018 [place].     ?au ??u do c?ng th?ng ([place] Headache) ?au ??u do c?ng th?ng l c?m gic ?au, p l?c ho?c nh?c nh?i, th??ng c?m th?y ? pha tr??c ho?c hai bn ??u. ?y l lo?i ?au ??u ph? bi?n nh?t. C?n ?au c th? l m ?, ho?c c th? c?m th?y c?ng th?ng (th?t l?i). ?au ??u do c?ng th?ng th??ng khng km theo bu?n nn ho?c nn v khng n?ng h?n khi ho?t ??ng th? ch?t. ?au ??u do c?ng th?ng th??ng ko di t? 30 pht ??n vi ngy. NGUYN NHN Khng r nguyn nhn chnh xc gy ra tnh tr?ng ny. Nh?c ??u do c?ng th?ng th??ng b?t ??u sau khi b? c?ng th?ng, h?i h?p ho?c tr?m c?m. Nh?ng tc nhn kh?i pht khc c th? bao g?m:  R??u.  Qu nhi?u caffein, ho?c cai caffein.  Nhi?m trng ???ng h h?p, ch?ng h?n nh? c?m l?nh, cm ho?c nhi?m trng xoang.  Nh?ng v?n ?? v? nha khoa ho?c nghi?n r?ng.  M?t m?i.  Gi? ??u v c? c?a qu v? ? cng v? tr trong m?t th?i gian di, ch?ng h?n nh? khi s? d?ng my tnh.  Ht thu?c l. TRI?U CH?NG Nh?ng tri?u ch?ng c?a tnh tr?ng ny bao g?m:  M?t c?m gic p l?c quanh ??u.  ?au ??u m ?, nh?c nh?i.  C?m th?y ?au ? pha tr??c v hai bn ??u.  Nh?y c?m ?au cc c? ? ??u, c? v vai. CH?N ?ON Tnh tr?ng ny c th? ???c ch?n ?on d?a vo cc tri?u ch?ng c?a qu  v? v khm th?c th?. C th? c?n lm cc ki?m tra, ch?ng h?n nh? ch?p CT ho?c ch?p MRI ??u. Cc ki?m tra ny c th? ???c th?c hi?n n?u tri?u ch?ng c?a qu v? n?ng ho?c b?t th??ng. ?I?U TR? Tnh tr?ng ny c th? ???c ?i?u tr? b?ng cch thay ??i l?i s?ng v cc lo?i thu?c gip gi?m tri?u ch?ng. H??NG D?N CH?M [place] T?I NH Qu?n l c?n ?au  Ch? s? d?ng thu?c khng c?n k ??n v thu?c c?n k ??n theo ch? d?n c?a chuyn gia ch?m [place] s?c kh?e.  N?m trong m?t phng t?i, yn t?nh khi qu v? b? ?au ??u.  N?u ???c ch? d?n, ch??m ? l?nh vo vng ??u v c?: ? Cho ? l?nh vo ti nh?a. ? ?? kh?n t?m ? [place] v ti ch??m. ? Ch??m ? l?nh trong 20 pht, 2-3 l?n m?i ngy.  S? d?ng ??m nhi?t ho?c t?m n??c nng ?? t?ng nhi?t vo vng ??u v c? theo ch? d?n c?a chuyn gia ch?m [place] s?c kh?e. ?n v u?ng  ?n u?ng theo l?ch bnh th??ng.  H?n ch? u?ng r??u.  Gi?m l??ng u?ng caffein, ho?c d?ng dng caffein. H??ng d?n chung  Tun th? t?t c? cc cu?c h?n khm l?i theo ch? d?n c?a chuyn gia ch?m [place] s?c kh?e. ?i?u ny c vai tr [place]  tr?ng.  Ghi nh?t k ?au ??u hng ngy ?? gip tm ra ?i?u g c th? gy cc c?n ?au ??u. V d?, hy ghi l?i: ? Qu v? ?n v u?ng g. ? Qu v? ? ng? bao lu. ? B?t k? thay ??i no trong ch? ?? ?n ho?c thu?c men.  Th? k? thu?t xoa bp ho?c cc k? thu?t th? gin khc.  H?n ch? c?ng th?ng.  Ng?i th?ng l?ng v trnh c?ng cc c?.  Khng s? d?ng cc s?n ph?m thu?c l no, bao g?m thu?c l d?ng ht, thu?c l d?ng nhai ho?c thu?c l ?i?n t?. N?u qu v? c?n gip ?? ?? cai thu?c, hy h?i chuyn gia ch?m [place] s?c kh?e.  T?p th? d?c th??ng xuyn theo ch? d?n c?a chuyn gia ch?m [place] s?c kh?e.  Ng? 7-9 ti?ng, ho?c th?i gian ng? theo khuy?n ngh? c?a chuyn gia ch?m [place] s?c kh?e. ?I KHM N?U:  Tri?u ch?ng c?a qu v? khng c?i thi?n ???c b?ng thu?c.  Qu v? c c?n ?au ??u khc v?i nh?ng g m qu v? th??ng c?m th?y.  Qu v? b? bu?n nn ho?c qu v? nn.  Qu v? b? s?t. NGAY L?P  T?C ?I KHM N?U:  Qu v? ?au ??u n?ng h?n.  Qu v? lin t?c b? nn m?a.  Qu v? b? c?ng c?.  Qu v? khng nhn th?y g.  Qu v? b? kh ni.  Qu v? b? ?au ? m?t ho?c tai.  Qu v? b? y?u c? ho?c m?t ki?m sot c?.  Qu v? m?t th?ng b?ng ho?c ?i l?i kh kh?n.  Qu v? c?m th?y mu?n ng?t ho?c qu v? ng?t.  Qu v? b? l l?n. Thng tin ny khng nh?m m?c ?ch thay th? cho l?i khuyn m chuyn gia ch?m [place] s?c kh?e ni v?i qu v?. Hy b?o ??m qu v? ph?i th?o lu?n b?t k? v?n ?? g m qu v? c v?i chuyn gia ch?m [place] s?c kh?e c?a qu v?. Document Released: 02/02/2005 Document Revised: 10/24/2014 Elsevier Interactive Patient Education  2017 [employer].     IF you received an x-ray today, you will receive an invoice from [facility]. Please contact [facility] at [phone] with questions or concerns regarding your invoice.   IF you received labwork today, you will receive an invoice from [place] [facility]. Please contact [facility] at [phone] with questions or concerns regarding your invoice.   Our billing staff will not be able to assist you with questions regarding bills from these companies.  You will be contacted with the lab results as soon as they are available. The fastest way to get your results is to activate your My Chart account. Instructions are located on the last page of this paperwork. If you have not heard from [place] regarding the results in 2 weeks, please contact this office.

## 2017-08-19 LAB — COMPREHENSIVE METABOLIC PANEL
A/G RATIO: 1.4 (ref 1.2–2.2)
ALK PHOS: 46 IU/L (ref 39–117)
ALT: 19 IU/L (ref 0–32)
AST: 21 IU/L (ref 0–40)
Albumin: 4.2 g/dL (ref 3.5–5.5)
BUN/Creatinine Ratio: 20 (ref 9–23)
BUN: 15 mg/dL (ref 6–24)
Bilirubin Total: 0.2 mg/dL (ref 0.0–1.2)
CO2: 22 mmol/L (ref 20–29)
CREATININE: 0.74 mg/dL (ref 0.57–1.00)
Calcium: 9.3 mg/dL (ref 8.7–10.2)
Chloride: 104 mmol/L (ref 96–106)
GFR calc Af Amer: 113 mL/min/{1.73_m2} (ref >59)
GFR, EST NON AFRICAN AMERICAN: 98 mL/min/{1.73_m2} (ref >59)
GLOBULIN, TOTAL: 3.1 g/dL (ref 1.5–4.5)
Glucose: 78 mg/dL (ref 65–99)
POTASSIUM: 4 mmol/L (ref 3.5–5.2)
SODIUM: 141 mmol/L (ref 134–144)
Total Protein: 7.3 g/dL (ref 6.0–8.5)

## 2017-11-10 ENCOUNTER — Other Ambulatory Visit: Payer: Self-pay | Admitting: Urgent Care

## 2017-11-10 NOTE — Telephone Encounter (Signed)
meloxicam refill Last Refill:08/18/17 # 30 2RF Last OV: 08/18/17 PCP: Wallis Bamberg [facility]acy:[facility] [address]

## 2018-02-22 ENCOUNTER — Other Ambulatory Visit: Payer: Self-pay | Admitting: Emergency Medicine

## 2018-02-22 NOTE — Telephone Encounter (Signed)
Copied from CRM [id number]. Topic: Quick Communication - Rx Refill/Question >> Feb 22, 2018  1:43 PM Gaynelle AduPoole, Shalonda wrote: Medication:lisinopril-hydrochlorothiazide (PRINZIDE,ZESTORETIC) 10-12.5 MG tablet  Has the patient contacted their pharmacy? yes  Preferred Pharmacy (with phone number or street name): [id number] [place],  - [address] [phone] (Phone) [phone] (Fax)    Agent: Please be advised that RX refills may take up to 3 business days. We ask that you follow-up with your pharmacy.

## 2018-03-15 ENCOUNTER — Other Ambulatory Visit: Payer: Self-pay | Admitting: Urgent Care

## 2018-03-15 ENCOUNTER — Other Ambulatory Visit: Payer: Self-pay

## 2018-03-15 ENCOUNTER — Ambulatory Visit (INDEPENDENT_AMBULATORY_CARE_PROVIDER_SITE_OTHER): Payer: Medicaid Other | Admitting: Emergency Medicine

## 2018-03-15 ENCOUNTER — Encounter: Payer: Self-pay | Admitting: Emergency Medicine

## 2018-03-15 ENCOUNTER — Ambulatory Visit (INDEPENDENT_AMBULATORY_CARE_PROVIDER_SITE_OTHER): Payer: Medicaid Other

## 2018-03-15 VITALS — BP 159/90 | HR 59 | Temp 98.5°F | Resp 16 | Ht <= 58 in | Wt 144.0 lb

## 2018-03-15 DIAGNOSIS — I10 Essential (primary) hypertension: Secondary | ICD-10-CM

## 2018-03-15 DIAGNOSIS — G44229 Chronic tension-type headache, not intractable: Secondary | ICD-10-CM

## 2018-03-15 DIAGNOSIS — R059 Cough, unspecified: Secondary | ICD-10-CM

## 2018-03-15 DIAGNOSIS — R05 Cough: Secondary | ICD-10-CM

## 2018-03-15 DIAGNOSIS — B349 Viral infection, unspecified: Secondary | ICD-10-CM

## 2018-03-15 LAB — COMPREHENSIVE METABOLIC PANEL
A/G RATIO: 1.4 (ref 1.2–2.2)
ALBUMIN: 4 g/dL (ref 3.8–4.8)
ALT: 13 IU/L (ref 0–32)
AST: 15 IU/L (ref 0–40)
Alkaline Phosphatase: 45 IU/L (ref 39–117)
BUN/Creatinine Ratio: 20 (ref 9–23)
BUN: 14 mg/dL (ref 6–24)
Bilirubin Total: <0.2 mg/dL (ref 0.0–1.2)
CALCIUM: 8.6 mg/dL — AB (ref 8.7–10.2)
CO2: 22 mmol/L (ref 20–29)
Chloride: 106 mmol/L (ref 96–106)
Creatinine, Ser: 0.7 mg/dL (ref 0.57–1.00)
GFR calc Af Amer: 120 mL/min/{1.73_m2} (ref >59)
GFR, EST NON AFRICAN AMERICAN: 104 mL/min/{1.73_m2} (ref >59)
GLUCOSE: 90 mg/dL (ref 65–99)
Globulin, Total: 2.9 g/dL (ref 1.5–4.5)
POTASSIUM: 4.5 mmol/L (ref 3.5–5.2)
SODIUM: 141 mmol/L (ref 134–144)
Total Protein: 6.9 g/dL (ref 6.0–8.5)

## 2018-03-15 LAB — POCT CBC
Granulocyte percent: 59.4 %G (ref 37–80)
HEMATOCRIT: 38.9 % (ref 29–41)
HEMOGLOBIN: 12.9 g/dL (ref 11–14.6)
LYMPH, POC: 2.4 (ref 0.6–3.4)
MCH, POC: 29.1 pg (ref 27–31.2)
MCHC: 33 g/dL (ref 31.8–35.4)
MCV: 88 fL (ref 76–111)
MID (cbc): 0.5 (ref 0–0.9)
MPV: 7.4 fL (ref 0–99.8)
PLATELET COUNT, POC: 283 10*3/uL (ref 142–424)
POC GRANULOCYTE: 4.3 (ref 2–6.9)
POC LYMPH %: 33.1 % (ref 10–50)
POC MID %: 7.5 %M (ref 0–12)
RBC: 4.43 M/uL (ref 4.04–5.48)
RDW, POC: 13.2 %
WBC: 7.2 10*3/uL (ref 4.6–10.2)

## 2018-03-15 MED ORDER — MELOXICAM 7.5 MG PO TABS: 7.5000 mg | ORAL_TABLET | Freq: Every day | ORAL | 1 refills | Status: DC | PRN

## 2018-03-15 MED ORDER — LISINOPRIL-HYDROCHLOROTHIAZIDE 10-12.5 MG PO TABS: 1.0000 | ORAL_TABLET | Freq: Every day | ORAL | 3 refills | Status: DC

## 2018-03-15 NOTE — Patient Instructions (Addendum)
   If you have lab work done today you will be contacted with your lab results within the next 2 weeks.  If you have not heard from us then please contact us. The fastest way to get your results is to register for My Chart.   IF you received an x-ray today, you will receive an invoice from [facility]. Please contact [facility] at [phone] with questions or concerns regarding your invoice.   IF you received labwork today, you will receive an invoice from [facility]. Please contact [facility] at [phone] with questions or concerns regarding your invoice.   Our billing staff will not be able to assist you with questions regarding bills from these companies.  You will be contacted with the lab results as soon as they are available. The fastest way to get your results is to activate your My Chart account. Instructions are located on the last page of this paperwork. If you have not heard from us regarding the results in 2 weeks, please contact this office.     T?ng huy?t p Hypertension Gi?i thi?u T?ng huy?t p, th??ng ???c g?i l huy?t p cao, l khi l?c b?m mu qua ??ng m?ch c?a qu v? qu m?nh. ??ng m?ch c?a qu v? l cc m?ch mu mang mu t? tim ?i kh?p c? th?. T?ng huy?t p khi?n tim lm vi?c v?t v? h?n ?? b?m mu v c th? khi?n cc ??ng m?ch tr? ln h?p ho?c c?ng. T?ng huy?t p khng ???c ?i?u tr? ho?c khng ki?m sot ???c c th? d?n t?i nh?i mu c? tim, ??t qu?, b?nh th?n v nh?ng v?n ?? khc. Ch? s? ?o huy?t p g?m m?t ch? s? cao trn m?t ch? s? th?p. Huy?t a?p ly? t???ng cu?a quy? vi? la? d??i 120/80. Ch? s? ??u tin ("??nh") ???c g?i l huy?t p tm thu. ?y l s? ?o p su?t trong ??ng m?ch khi tim qu v? ??p. Ch? s? th? hai ("?y") ???c g?i l huy?t p tm tr??ng. ?y l s? ?o p su?t trong ??ng m?ch khi tim qu v? ngh?. Nguyn nhn g gy ra? Khng r nguyn nhn gy ra tnh tr?ng ny. ?i?u g lm t?ng nguy c?? M?t s? y?u t? nguy c? d?n ??n huy?t p cao c  th? ki?m sot ???c. M?t s? y?u t? khc th khng. Nh?ng y?u t? qu v? c th? thay ??i  Ht thu?c.  B? b?nh ti?u ???ng tup 2, cholesterol cao, ho?c c? hai.  Khng t?p th? d?c ho?c cc ho?t ??ng th? ch?t ??y ??.  Th?a cn.  ?n qu nhi?u ch?t bo, ???ng, ca-lo, ho?c mu?i (Natri).  U?ng qu nhi?u r??u. Nh?ng y?u t? kh ho?c khng th? thay ??i  B?nh th?n m?n tnh.  C ti?n s? gia ?nh b? cao huy?t p.  ?? tu?i. Nguy c? t?ng ln theo ?? tu?i.  Ch?ng t?c. Qu v? c th? c nguy c? cao h?n n?u qu v? l ng??i M? g?c Phi.  Gi?i tnh. Nam gi?i c nguy c? cao h?n ph? n? tr??c tu?i 46. Sau tu?i 66, ph? n? c nguy c? cao h?n nam gi?i.  Ng?ng th? do t?c ngh?n khi ng?.  C?ng th?ng. Cc d?u hi?u ho?c tri?u ch?ng l g? Huy?t p qu cao (c?n t?ng huy?t p) c th? gy ra:  ?au ??u.  Lo u.  Kh th?.  Ch?y mu cam.  Bu?n nn v nn.  ?au ng?c n?ng.  C? ??ng gi?t gi?t qu v?   khng th? ki?m sot ???c (co gi?t). Ch?n ?on tnh tr?ng ny nh? th? no? Tnh tr?ng ny ???c ch?n ?on b?ng cch ?o huy?t p c?a qu v? lc qu v? ng?i, ?? tay trn m?t m?t ph?ng. B?ng qu?n thi?t b? ?o huy?t p s? ???c qu?n tr?c ti?pvo vng da cnh tay pha trn c?a qu v? ngang v?i m?c tim. Huy?t p c?n ???c ?o t nh?t hai l?n trn cng m?t cnh tay. M?t s? tnh tr?ng nh?t ??nh c th? lm cho huy?t p khc nhau gi?a tay ph?i v tay tri c?a qu v?. M?t s? y?u t? nh?t ??nh c th? khi?n ch? s? ?o huy?t p th?p h?n ho?c cao h?n so v?i bnh th??ng (t?ng) trong th?i gian ng?n:  Khi huy?t p c?a qu v? ? phng khm c?a chuyn gia ch?m sc s?c kh?e cao h?n so v?i lc qu v? ? nh, hi?n t??ng ny ???c g?i l t?ng huy?t p o chong tr?ng. H?u h?t nh?ng ng??i b? tnh tr?ng ny ??u khng c?n dng thu?c.  Khi huy?t p c?a qu v? lc ? nh cao h?n so v?i lc qu v? ? phng khm chuyn gia ch?m sc s?c kh?e, hi?n t??ng ny ???c g?i l t?ng huy?t p m?t n?. H?u h?t nh?ng ng??i b? tnh tr?ng ny ??u c th? c?n dng thu?c ??  ki?m sot huy?t p. N?u qu v? c ch? s? huy?t p cao trong m?t l?n khm ho?c qu v? c huy?t p bnh th??ng c km cc y?u t? nguy c? khc:  Qu v? c th? ???c yu c?u tr? l?i vo m?t ngy khc ?? ki?m tra l?i huy?t p.  Qu v? c th? ???c yu c?u theo di huy?t p t?i nh trong vng 1 tu?n ho?c lu h?n. N?u qu v? ???c ch?n ?on b? t?ng huy?t p, qu v? c th? c?n th?c hi?n cc xt nghi?m mu ho?c ki?m tra hnh ?nh khc ?? gip chuyn gia ch?m sc s?c kh?e hi?u nguy c? t?ng th? m?c cc b?nh tr?ng khc. Tnh tr?ng ny ???c ?i?u tr? nh? th? no? Tnh tr?ng ny ???c ?i?u tr? b?ng cch thay ??i l?i s?ng lnh m?nh, ch?ng h?nh nh? ?n th?c ph?m c l?i cho s?c kh?e, t?p th? d?c nhi?u h?n v gi?m l??ng r??u u?ng vo. N?u thay ??i l?i s?ng khng ?? ?? ??a huy?t p v? m?c c th? ki?m sot ???c, chuyn gia ch?m sc s?c kh?e c th? k ??n thu?c, v n?u:  Huy?t p tm thu c?a qu v? trn 130.  Huy?t p tm tr??ng c?a qu v? trn 80. Huy?t p m?c tiu c nhn c?a qu v? c th? khc nhau ty thu?c v tnh tr?ng b?nh l, tu?i v cc nhn t? khc. Tun th? nh?ng h??ng d?n ny ? nh: ?n v u?ng   ?n ch? ?? giu ch?t x? v kali v t natri, ???ng ph? gia v ch?t bo. M?t k? ho?ch ?n m?u c tn ch? ?? ?n DASH (Cch ti?p c?n ?n u?ng ?? gi?m t?ng huy?t p). ?n theo cch ny: ? ?n nhi?u tri cy v rau t??i. Vo m?i b?a ?n, c? g?ng dnh m?t n?a ??a cho tri cy v rau. ? ?n ng? c?c nguyn h?t, ch?ng h?n nh? m ?ng lm t? b?t m nguyn cm, ho?c bnh m nguyn h?t. Cho ngu? c?c nguyn ca?m va?o m?t ph?n t? ??a c?a quy? vi?. ? ?n ho?c hu?ng cc s?n ph?m t? s?a t bo, ch?ng h?n   nh? s?a ? b? kem ho?c s?a chua t bo. ? Trnh nh?ng mi?ng th?t nhi?u m?, th?t ? qua ch? bi?n ho?c th?t ??p mu?i v th?t gia c?m c da. Dnh kho?ng m?t ph?n t? ??a c?a qu v? cho cc protein khng m?, ch?ng h?n nh? c, th?t g khng da, ??u, tr?ng, v ??u ph?. ? Trnh nh?ng th?c ph?m ch? bi?n ho?c lm s?n. Nh?ng th?c ph?m ny th??ng c  nhi?u natri, ???ng ph? gia v ch?t bo h?n.  Gi?m l??ng dng natri hng ngy c?a qu v?. H?u h?t nh?ng ng??i b? t?ng huy?t p ??u nn ?n d??i 1.500 mg natri m?i ngy.  Gi?i h?n l??ng r??u qu v? u?ng khng qu 1 ly m?i ngy v?i ph? n? khng mang thai v 2 ly m?i ngy v?i nam gi?i. M?t ly t??ng ???ng v?i 12 ao-x? bia, 5 ao-x? r??u vang, ho?c 1 ao-x? r??u m?nh. L?i s?ng  H?p tc v?i chuyn gia ch?m sc s?c kh?e c?a qu v? ?? duy tr tr?ng l??ng c? th? c l?i cho s?c kh?e ho?c gi?m cn. Hy h?i xem tr?ng l??ng no l l t??ng cho qu v?.  Dnh t nh?t 30 pht ?? t?p th? d?c m c th? khi?n tim qu v? ??p nhanh h?n (t?p th? d?c nh?p ?i?u) h?u h?t cc ngy trong tu?n. Cc ho?t ??ng c th? bao g?m ?i b?, b?i, ho?c ??p xe.  Bao g?m bi t?p t?ng c??ng c? (bi t?p khng l?c), ch?ng h?n nh? bi t?p [place] ho?c nng t?, nh? m?t ph?n c?a thi quen luy?n t?p hng tu?n c?a qu v?. C? g?ng t?p nh?ng lo?i bi t?p ny trong vng 30 pht t?i thi?u 3 ngy m?t tu?n.  Khng s? d?ng b?t k? s?n ph?m no ch?a nicotine ho?c thu?c l, ch?ng ha?n nh? thu?c l d?ng ht v thu?c l ?i?n t?. N?u qu v? c?n gip ?? ?? cai thu?c, hy h?i chuyn gia ch?m sc s?c kh?e.  Theo di huy?t p c?a qu v? t?i nh theo h??ng d?n c?a chuyn gia ch?m sc s?c kh?e.  Tun th? t?t c? cc cu?c h?n khm l?i theo ch? d?n c?a chuyn gia ch?m sc s?c kh?e. ?i?u ny c vai tr quan tr?ng. Thu?c  Ch? s? d?ng thu?c khng k ??n v thu?c k ??n theo ch? d?n c?a chuyn gia ch?m sc s?c kh?e. Lm theo ch? d?n m?t cch c?n th?n. Thu?c ?i?u tr? huy?t p ph?i ???c dng theo ??n ? k.  Khng b? li?u thu?c huy?t p. B? li?u khi?n qu v? c nguy c? g?p ph?i cc v?n ?? v c th? lm cho thu?c gi?m hi?u qu?.  Hy h?i chuyn gia ch?m sc s?c kh?e c?a qu v? v? nh?ng tc d?ng ph? ho?c ph?n ?ng v?i thu?c m qu v? ph?i theo di. Hy lin l?c v?i chuyn gia ch?m sc s?c kh?e n?u:  Qu v? ngh? qu v? c ph?n ?ng v?i thu?c ?ang dng.  Qu v? b? ?au ??u  ti?p t?c tr? l?i (ti pht).  Qu v? c?m th?y chng m?t.  Qu v? b? s?ng ph ? m?t c chn.  Qu v? c v?n ?? v? th? l?c. Yu c?u tr? gip ngay l?p t?c n?u:  Qu v? b? ?au ??u n?ng ho?c l l?n.  Qu v? b? y?u b?t th??ng ho?c t b.  Quy? vi? ca?m th?y bi? ng?t.  Qu v? b? ?au r?t nhi?u ? ng?c ho?c b?ng.  Qu v? nn nhi?u l?n.    Qu v? b? kh th?. Tm t?t  T?ng huy?t p l khi l?c b?m mu qua ??ng m?ch c?a qu v? qu m?nh. N?u tnh tr?ng ny khng ???c ki?m sot, n c th? khi?n qu v? g?p ph?i nguy c? bi?n ch?ng nghim tr?ng.  Huy?t p m?c tiu c nhn c?a qu v? c th? khc nhau ty thu?c v tnh tr?ng b?nh l, tu?i v cc nhn t? khc. ??i v?i h?u h?t m?i ng??i, huy?t p bnh th??ng l d??i 120/80.  ?i?u tr? t?ng huy?t p b?ng cch thay ??i l?i s?ng, dng thu?c, ho?c k?t h?p c? hai. Thay ??i l?i s?ng bao g?m gi?m cn, ?n ch? ?? ?n c l?i cho s?c kh?e, t mu?i, t?p th? d?c nhi?u h?n v h?n ch? u?ng r??u. Thng tin ny khng nh?m m?c ?ch thay th? cho l?i khuyn m chuyn gia ch?m sc s?c kh?e ni v?i qu v?. Hy b?o ??m qu v? ph?i th?o lu?n b?t k? v?n ?? g m qu v? c v?i chuyn gia ch?m sc s?c kh?e c?a qu v?. Document Released: 02/02/2005 Document Revised: 01/15/2016 Document Reviewed: 01/15/2016 [employer] Interactive Patient Education  2019 [employer]  

## 2018-03-15 NOTE — Telephone Encounter (Signed)
This patient was seen by me today and meloxicam was prescribed.

## 2018-03-15 NOTE — Progress Notes (Signed)
[name] 48 y.o.   Chief Complaint  Patient presents with  . Medication Refill    lisinopril-hctz and meloxicam   BP Readings from Last 3 Encounters:  03/15/18 (!) 159/90  08/18/17 (!) 153/74  04/05/17 (!) 116/54     HISTORY OF PRESENT ILLNESS: This is a 48 y.o. female with history of hypertension, off her medication for 1 month, needs medication refill.  Presently taking Zestoretic 10-12.5 mg.  Gets occasional headaches when blood pressure is high and takes meloxicam which helps. Also complaining of 1 month history of dry cough, worse at night along with fatigue and generalized body aches.  Returned from trip to [place] last November and symptoms started 1 week after coming back.  No other significant symptoms.   HPI   Prior to Admission medications   Medication Sig Start Date End Date Taking? Authorizing Provider  lisinopril-hydrochlorothiazide (PRINZIDE,ZESTORETIC) 10-12.5 MG tablet Take 1 tablet by mouth daily. 08/18/17  Yes Wallis BambergMani, Mario, PA-C  cyclobenzaprine (FLEXERIL) 10 MG tablet Take 0.5-1 tablets (5-10 mg total) by mouth at bedtime. Patient not taking: Reported on 03/15/2018 08/18/17   Wallis BambergMani, Mario, PA-C  meloxicam (MOBIC) 7.5 MG tablet TAKE 1 TABLET BY MOUTH EVERY DAY Patient not taking: Reported on 03/15/2018 11/10/17   Wallis BambergMani, Mario, PA-C  ondansetron (ZOFRAN) 4 MG tablet Take 4 mg by mouth 2 (two) times daily as needed for nausea or vomiting.    [provider]    Allergies  Allergen Reactions  . Shellfish Allergy Anaphylaxis and Swelling    All over body  . Chicken (Grilled) Flavor [Flavoring Agent] Swelling    All over body    Patient Active Problem List   Diagnosis Date Noted  . Allergic reaction 04/05/2017  . Urticaria 04/05/2017  . Influenza 04/05/2017  . Upper back pain 07/11/2014  . Essential hypertension 03/26/2010  . HEADACHE, CHRONIC 03/26/2010    Past Medical History:  Diagnosis Date  . Hypertension     Past Surgical History:  Procedure  Laterality Date  . CESAREAN SECTION    . TUBAL LIGATION      Social History   Socioeconomic History  . Marital status: Married    Spouse name: Not on file  . Number of children: Not on file  . Years of education: Not on file  . Highest education level: Not on file  Occupational History  . Not on file  Social Needs  . Financial resource strain: Not on file  . Food insecurity:    Worry: Not on file    Inability: Not on file  . Transportation needs:    Medical: Not on file    Non-medical: Not on file  Tobacco Use  . Smoking status: Never Smoker  . Smokeless tobacco: Never Used  Substance and Sexual Activity  . Alcohol use: No    Alcohol/week: 0.0 standard drinks  . Drug use: No  . Sexual activity: Not on file  Lifestyle  . Physical activity:    Days per week: Not on file    Minutes per session: Not on file  . Stress: Not on file  Relationships  . Social connections:    Talks on phone: Not on file    Gets together: Not on file    Attends religious service: Not on file    Active member of club or organization: Not on file    Attends meetings of clubs or organizations: Not on file    Relationship status: Not on file  . Intimate  partner violence:    Fear of current or ex partner: Not on file    Emotionally abused: Not on file    Physically abused: Not on file    Forced sexual activity: Not on file  Other Topics Concern  . Not on file  Social History Narrative  . Not on file    No family history on file.   Review of Systems  Constitutional: Positive for malaise/fatigue. Negative for chills and fever.  HENT: Negative.  Negative for congestion, hearing loss, nosebleeds and sore throat.   Eyes: Negative.  Negative for blurred vision and double vision.  Respiratory: Positive for cough. Negative for hemoptysis, sputum production, shortness of breath and wheezing.   Cardiovascular: Negative.  Negative for chest pain and palpitations.  Gastrointestinal: Negative.   Negative for abdominal pain, blood in stool, diarrhea, melena, nausea and vomiting.  Genitourinary: Negative.  Negative for dysuria and hematuria.  Musculoskeletal: Positive for myalgias.  Skin: Negative.  Negative for rash.  Neurological: Positive for headaches. Negative for dizziness.  Endo/Heme/Allergies: Negative.   All other systems reviewed and are negative.   Vitals:   03/15/18 1016  BP: (!) 159/90  Pulse: (!) 59  Resp: 16  Temp: 98.5 F (36.9 C)  SpO2: 97%    Physical Exam Vitals signs reviewed.  Constitutional:      Appearance: Normal appearance.  HENT:     Head: Normocephalic and atraumatic.     Nose: Nose normal.     Mouth/Throat:     Mouth: Mucous membranes are moist.     Pharynx: Oropharynx is clear.  Eyes:     Extraocular Movements: Extraocular movements intact.     Conjunctiva/sclera: Conjunctivae normal.     Pupils: Pupils are equal, round, and reactive to light.  Neck:     Musculoskeletal: Normal range of motion and neck supple. No muscular tenderness.  Cardiovascular:     Rate and Rhythm: Normal rate and regular rhythm.     Heart sounds: Normal heart sounds.  Pulmonary:     Effort: Pulmonary effort is normal.     Breath sounds: Normal breath sounds.  Abdominal:     Tenderness: There is no abdominal tenderness.  Musculoskeletal: Normal range of motion.  Lymphadenopathy:     Cervical: No cervical adenopathy.  Skin:    General: Skin is warm and dry.     Capillary Refill: Capillary refill takes less than 2 seconds.  Neurological:     General: No focal deficit present.     Mental Status: She is alert and oriented to person, place, and time.  Psychiatric:        Mood and Affect: Mood normal.        Behavior: Behavior normal.    Results for orders placed or performed in visit on 03/15/18 (from the past 24 hour(s))  POCT CBC     Status: None   Collection Time: 03/15/18 11:13 AM  Result Value Ref Range   WBC 7.2 4.6 - 10.2 K/uL   Lymph, poc 2.4  0.6 - 3.4   POC LYMPH PERCENT 33.1 10 - 50 %L   MID (cbc) 0.5 0 - 0.9   POC MID % 7.5 0 - 12 %M   POC Granulocyte 4.3 2 - 6.9   Granulocyte percent 59.4 37 - 80 %G   RBC 4.43 4.04 - 5.48 M/uL   Hemoglobin 12.9 11 - 14.6 g/dL   HCT, POC 16.138.9 29 - 41 %   MCV 88.0 76 - 111  fL   MCH, POC 29.1 27 - 31.2 pg   MCHC 33.0 31.8 - 35.4 g/dL   RDW, POC 16.1 %   Platelet Count, POC 283 142 - 424 K/uL   MPV 7.4 0 - 99.8 fL   Dg Chest 2 View  Result Date: 03/15/2018 CLINICAL DATA:  Cough over the last month.  Body aches. EXAM: CHEST - 2 VIEW COMPARISON:  None. FINDINGS: Heart size is normal. Right paratracheal prominence could be vascular or related to adenopathy. The pulmonary vascularity is normal. Lungs are clear. No effusions. No bony abnormality seen. IMPRESSION: No active pulmonary parenchymal finding. Right paratracheal density which could relate to vascular structures or adenopathy. Are there old films at any other location? If not, consider chest CT. Electronically Signed   By: Paulina Fusi M.D.   On: 03/15/2018 11:04    A total of 40 minutes was spent in the room with the patient and daughter, greater than 50% of which was in counseling/coordination of care regarding differential diagnosis, review of blood work and chest x-ray, medications, treatment, and need for follow-up.   ASSESSMENT & PLAN: Blakeley was seen today for medication refill.  Diagnoses and all orders for this visit:  Essential hypertension -     lisinopril-hydrochlorothiazide (PRINZIDE,ZESTORETIC) 10-12.5 MG tablet; Take 1 tablet by mouth daily.  Cough -     DG Chest 2 View; Future  Viral illness -     POCT CBC -     Comprehensive metabolic panel  Chronic tension-type headache, not intractable -     meloxicam (MOBIC) 7.5 MG tablet; Take 1 tablet (7.5 mg total) by mouth daily as needed for pain.     Patient Instructions       If you have lab work done today you will be contacted with your lab results within  the next 2 weeks.  If you have not heard from [place] then please contact us. The fastest way to get your results is to register for My Chart.   IF you received an x-ray today, you will receive an invoice from [facility]. Please contact [facility] at [phone] with questions or concerns regarding your invoice.   IF you received labwork today, you will receive an invoice from [facility]. Please contact [facility] at [phone] with questions or concerns regarding your invoice.   Our billing staff will not be able to assist you with questions regarding bills from these companies.  You will be contacted with the lab results as soon as they are available. The fastest way to get your results is to activate your My Chart account. Instructions are located on the last page of this paperwork. If you have not heard from [place] regarding the results in 2 weeks, please contact this office.     T?ng huy?t p Hypertension Gi?i thi?u T?ng huy?t p, th??ng ???c g?i l huy?t p cao, l khi l?c b?m mu qua ??ng m?ch c?a qu v? qu m?nh. ??ng m?ch c?a qu v? l cc m?ch mu mang mu t? tim ?i kh?p c? th?. T?ng huy?t p khi?n tim lm vi?c v?t v? h?n ?? b?m mu v c th? khi?n cc ??ng m?ch tr? ln h?p ho?c c?ng. T?ng huy?t p khng ???c ?i?u tr? ho?c khng ki?m sot ???c c th? d?n t?i nh?i mu c? tim, ??t qu?, b?nh th?n v nh?ng v?n ?? khc. Ch? s? ?o huy?t p g?m m?t ch? s? cao trn m?t ch? s? th?p. Huy?t a?p ly? t???ng cu?a quy? vi?  la? d??i 120/80. Ch? s? ??u tin ("??nh") ???c g?i l huy?t p tm thu. ?y l s? ?o p su?t trong ??ng m?ch khi tim qu v? ??p. Ch? s? th? hai ("?y") ???c g?i l huy?t p tm tr??ng. ?y l s? ?o p su?t trong ??ng m?ch khi tim qu v? ngh?[place] nhn g gy ra? Khng r nguyn nhn gy ra tnh tr?ng ny. ?i?u g lm t?ng nguy c?? M?t s? y?u t? nguy c? d?n ??n huy?t p cao c th? ki?m sot ???c. M?t s? y?u t? khc th khng. Nh?ng y?u t? qu v? c th? thay ??i  Ht  thu?c.  B? b?nh ti?u ???ng tup 2, cholesterol cao, ho?c c? hai.  Khng t?p th? d?c ho?c cc ho?t ??ng th? ch?t ??y ??Marland Kitchen  Th?a cn.  ?n qu nhi?u ch?t bo, ???ng, ca-lo, ho?c mu?i ([place]).  U?ng qu nhi?u r??u. Nh?ng y?u t? kh ho?c khng th? thay ??i  B?nh th?n m?n tnh.  C ti?n s? gia ?nh b? cao huy?t p.  ?? tu?i. Nguy c? t?ng ln theo ?? tu?i.  Ch?ng t?c. Qu v? c th? c nguy c? cao h?n n?u qu v? l ng??i M? g?c Phi.  Gi?i tnh. Nam gi?i c nguy c? cao h?n ph? n? tr??c tu?i 45. Sau tu?i 66, ph? n? c nguy c? cao h?n nam gi?i.  Ng?ng th? do t?c ngh?n khi ng?.  C?ng th?ng. Cc d?u hi?u ho?c tri?u ch?ng l g? Huy?t p qu cao (c?n t?ng huy?t p) c th? gy ra:  ?au ??u.  Lo u.  Kh th?.  Ch?y mu cam.  Bu?n nn v nn.  ?au ng?c n?ng.  C? ??ng gi?t gi?t qu v? khng th? ki?m sot ???c (co gi?t). Ch?n ?on tnh tr?ng ny nh? th? no? Tnh tr?ng ny ???c ch?n ?on b?ng cch ?o huy?t p c?a qu v? lc qu v? ng?i, ?? tay trn m?t m?t ph?ng. B?ng qu?n thi?t b? ?o huy?t p s? ???c qu?n tr?c ti?pvo vng da cnh tay pha trn c?a qu v? ngang v?i m?c tim. Huy?t p c?n ???c ?o t nh?t hai l?n trn cng m?t cnh tay. M?t s? tnh tr?ng nh?t ??nh c th? lm cho huy?t p khc nhau gi?a tay ph?i v tay tri c?a qu v?. M?t s? y?u t? nh?t ??nh c th? khi?n ch? s? ?o huy?t p th?p h?n ho?c cao h?n so v?i bnh th??ng (t?ng) trong th?i gian ng?n:  Khi huy?t p c?a qu v? ? phng khm c?a chuyn gia ch?m [place] s?c kh?e cao h?n so v?i lc qu v? ? nh, hi?n t??ng ny ???c g?i l t?ng huy?t p o chong tr?ng. H?u h?t nh?ng ng??i b? tnh tr?ng ny ??u khng c?n dng thu?c.  Khi huy?t p c?a qu v? lc ? nh cao h?n so v?i lc qu v? ? phng khm chuyn gia ch?m [place] s?c kh?e, hi?n t??ng ny ???c g?i l t?ng huy?t p m?t n?. H?u h?t nh?ng ng??i b? tnh tr?ng ny ??u c th? c?n dng thu?c ?? ki?m sot huy?t p. N?u qu v? c ch? s? huy?t p cao trong m?t l?n khm ho?c qu v? c huy?t  p bnh th??ng c km cc y?u t? nguy c? khc:  Qu v? c th? ???c yu c?u tr? l?i vo m?t ngy khc ?? ki?m tra l?i huy?t p.  Qu v? c th? ???c yu c?u theo di huy?t p t?i nh trong vng 1 tu?n  ho?c lu h?n. N?u qu v? ???c ch?n ?on b? t?ng huy?t p, qu v? c th? c?n th?c hi?n cc xt nghi?m mu ho?c ki?m tra hnh ?nh khc ?? gip chuyn gia ch?m [place] s?c kh?e hi?u nguy c? t?ng th? m?c cc b?nh tr?ng khc. Tnh tr?ng ny ???c ?i?u tr? nh? th? no? Tnh tr?ng ny ???c ?i?u tr? b?ng cch thay ??i l?i s?ng lnh m?nh, ch?ng h?nh nh? ?n th?c ph?m c l?i cho s?c kh?e, t?p th? d?c nhi?u h?n v gi?m l??ng r??u u?ng vo. N?u thay ??i l?i s?ng khng ?? ?? ??a huy?t p v? m?c c th? ki?m sot ???c, chuyn gia ch?m [place] s?c kh?e c th? k ??n thu?c, v n?u:  Huy?t p tm thu c?a qu v? trn 130.  Huy?t p tm tr??ng c?a qu v? trn 80. Huy?t p m?c tiu c nhn c?a qu v? c th? khc nhau ty thu?c v tnh tr?ng b?nh l, tu?i v cc nhn t? khc. Tun th? nh?ng h??ng d?n ny ? nh: ?n v u?ng   ?n ch? ?? giu ch?t x? v kali v t natri, ???ng ph? gia v ch?t bo. M?t k? ho?ch ?n m?u c tn ch? ?? ?n DASH (Cch ti?p c?n ?n u?ng ?? gi?m t?ng huy?t p). ?n theo cch ny: ? ?n nhi?u tri cy v rau t??i. Vo m?i b?a ?n, c? g?ng dnh m?t n?a ??a cho tri cy v rau. ? ?n ng? c?c nguyn h?t, ch?ng h?n nh? m ?ng lm t? b?t m nguyn cm, ho?c bnh m nguyn h?t. Cho ngu? c?c nguyn ca?m va?o m?t ph?n t? ??a c?a quy? vi?. ? ?n ho?c hu?ng cc s?n ph?m t? s?a t bo, ch?ng h?n nh? s?a ? b? kem ho?c s?a chua t bo. ? Trnh nh?ng mi?ng th?t nhi?u m?, th?t ? qua ch? bi?n ho?c th?t ??p mu?i v th?t gia c?m c da. Dnh kho?ng m?t ph?n t? ??a c?a qu v? cho cc protein khng m?, ch?ng h?n nh? c, th?t g khng da, ??u, tr?ng, v ??u ph?. ? Trnh nh?ng th?c ph?m ch? bi?n ho?c lm s?n. Nh?ng th?c ph?m ny th??ng c nhi?u natri, ???ng ph? gia v ch?t bo h?n.  Gi?m l??ng dng natri hng ngy c?a qu v?. H?u h?t  nh?ng ng??i b? t?ng huy?t p ??u nn ?n d??i 1.500 mg natri m?i ngy.  Gi?i h?n l??ng r??u qu v? u?ng khng qu 1 ly m?i ngy v?i ph? n? khng mang thai v 2 ly m?i ngy v?i nam gi?i. M?t ly t??ng ???ng v?i 12 ao-x? bia, 5 ao-x? r??u vang, ho?c 1 ao-x? r??u m?nh. L?i s?ng  H?p tc v?i chuyn gia ch?m [place] s?c kh?e c?a qu v? ?? duy tr tr?ng l??ng c? th? c l?i cho s?c kh?e ho?c gi?m cn. Hy h?i xem tr?ng l??ng no l l t??ng cho qu v?.  Dnh t nh?t 30 pht ?? t?p th? d?c m c th? khi?n tim qu v? ??p nhanh h?n (t?p th? d?c nh?p ?i?u) h?u h?t cc ngy trong tu?n. Cc ho?t ??ng c th? bao g?m ?i b?, b?i, ho?c ??p xe.  Bao g?m bi t?p t?ng c??ng c? (bi t?p khng l?c), ch?ng h?n nh? bi t?p [place] ho?c nng t?, nh? m?t ph?n c?a thi quen luy?n t?p hng tu?n c?a qu v?. C? g?ng t?p nh?ng lo?i bi t?p ny trong vng 30 pht t?i thi?u 3 ngy m?t tu?n.  Khng s? d?ng b?t k? s?n ph?m no  ch?a nicotine ho?c thu?c l, ch?ng ha?n nh? thu?c l d?ng ht v thu?c l ?i?n t?. N?u qu v? c?n gip ?? ?? cai thu?c, hy h?i chuyn gia ch?m [place] s?c kh?e.  Theo di huy?t p c?a qu v? t?i nh theo h??ng d?n c?a chuyn gia ch?m [place] s?c kh?e.  Tun th? t?t c? cc cu?c h?n khm l?i theo ch? d?n c?a chuyn gia ch?m [place] s?c kh?e. ?i?u ny c vai tr quan tr?ng. Thu?c  Ch? s? d?ng thu?c khng k ??n v thu?c k ??n theo ch? d?n c?a chuyn gia ch?m [place] s?c kh?e. Lm theo ch? d?n m?t cch c?n th?n. Thu?c ?i?u tr? huy?t p ph?i ???c dng theo ??n ? k.  Khng b? li?u thu?c huy?t p. B? li?u khi?n qu v? c nguy c? g?p ph?i cc v?n ?? v c th? lm cho thu?c gi?m hi?u qu?Marland Kitchen  Hy h?i chuyn gia ch?m [place] s?c kh?e c?a qu v? v? nh?ng tc d?ng ph? ho?c ph?n ?ng v?i thu?c m qu v? ph?i theo di. Hy lin l?c v?i chuyn gia ch?m [place] s?c kh?e n?u:  Qu v? ngh? qu v? c ph?n ?ng v?i thu?c ?ang dng.  Qu v? b? ?au ??u ti?p t?c tr? l?i (ti pht).  Qu v? c?m th?y chng m?t.  Qu v? b? s?ng ph ? m?t c  chn.  Qu v? c v?n ?? v? th? l?c. Yu c?u tr? gip ngay l?p t?c n?u:  Qu v? b? ?au ??u n?ng ho?c l l?n.  Qu v? b? y?u b?t th??ng ho?c t b.  Quy? vi? ca?m th?y bi? ng?t.  Qu v? b? ?au r?t nhi?u ? ng?c ho?c b?ng.  Qu v? nn nhi?u l?n.  Qu v? b? kh th?. Tm t?t  T?ng huy?t p l khi l?c b?m mu qua ??ng m?ch c?a qu v? qu m?nh. N?u tnh tr?ng ny khng ???c ki?m sot, n c th? khi?n qu v? g?p ph?i nguy c? bi?n ch?ng nghim tr?ng.  Huy?t p m?c tiu c nhn c?a qu v? c th? khc nhau ty thu?c v tnh tr?ng b?nh l, tu?i v cc nhn t? khc. ??i v?i h?u h?t m?i ng??i, huy?t p bnh th??ng l d??i 120/80.  ?i?u tr? t?ng huy?t p b?ng cch thay ??i l?i s?ng, dng thu?c, ho?c k?t h?p c? hai. Thay ??i l?i s?ng bao g?m gi?m cn, ?n ch? ?? ?n c l?i cho s?c kh?e, t mu?i, t?p th? d?c nhi?u h?n v h?n ch? u?ng r??u. Thng tin ny khng nh?m m?c ?ch thay th? cho l?i khuyn m chuyn gia ch?m [place] s?c kh?e ni v?i qu v?. Hy b?o ??m qu v? ph?i th?o lu?n b?t k? v?n ?? g m qu v? c v?i chuyn gia ch?m [place] s?c kh?e c?a qu v?. Document Released: 02/02/2005 Document Revised: 01/15/2016 Document Reviewed: 01/15/2016 Elsevier Interactive Patient Education  2019 [employer].     Edwina Barth, MD [facility]

## 2018-03-15 NOTE — Telephone Encounter (Signed)
pls see note. Pt taken med for neck spasms and tension headache

## 2018-09-14 ENCOUNTER — Other Ambulatory Visit: Payer: Self-pay

## 2018-09-14 ENCOUNTER — Encounter: Payer: Self-pay | Admitting: Emergency Medicine

## 2018-09-14 ENCOUNTER — Ambulatory Visit (INDEPENDENT_AMBULATORY_CARE_PROVIDER_SITE_OTHER): Payer: Self-pay | Admitting: Emergency Medicine

## 2018-09-14 VITALS — BP 131/77 | HR 53 | Temp 98.0°F | Resp 16 | Wt 144.4 lb

## 2018-09-14 DIAGNOSIS — I10 Essential (primary) hypertension: Secondary | ICD-10-CM

## 2018-09-14 DIAGNOSIS — R12 Heartburn: Secondary | ICD-10-CM

## 2018-09-14 DIAGNOSIS — G44229 Chronic tension-type headache, not intractable: Secondary | ICD-10-CM

## 2018-09-14 MED ORDER — LISINOPRIL-HYDROCHLOROTHIAZIDE 10-12.5 MG PO TABS: 1.0000 | ORAL_TABLET | Freq: Every day | ORAL | 3 refills | Status: DC

## 2018-09-14 NOTE — Progress Notes (Signed)
BP Readings from Last 3 Encounters:  09/14/18 131/77  03/15/18 (!) 159/90  08/18/17 (!) 153/74   [name] 48 y.o.   Chief Complaint  Patient presents with  . Medication Refill    Zestoric  . Hypertension    6 MONTH FOLLOW UP    HISTORY OF PRESENT ILLNESS: This is a 48 y.o. female with history of hypertension here for follow-up and medication refill.  Taking Zestoretic with good compliance and no side effects.  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 Has a history of chronic intermittent headaches.  Has been taking NSAIDs on a regular basis. Also complaining of chronic heartburn, may be related to NSAID use. No other complaints or medical concerns today.  HPI   Prior to Admission medications   Medication Sig Start Date End Date Taking? Authorizing Provider  lisinopril-hydrochlorothiazide (PRINZIDE,ZESTORETIC) 10-12.5 MG tablet Take 1 tablet by mouth daily. 03/15/18  Yes Rain Wil[place], Eilleen Kempf, MD  ondansetron (ZOFRAN) 4 MG tablet Take 4 mg by mouth 2 (two) times daily as needed for nausea or vomiting.   Yes [provider]  cyclobenzaprine (FLEXERIL) 10 MG tablet Take 0.5-1 tablets (5-10 mg total) by mouth at bedtime. Patient not taking: Reported on 09/14/2018 08/18/17   Wallis Bamberg, PA-C  meloxicam (MOBIC) 7.5 MG tablet Take 1 tablet (7.5 mg total) by mouth daily as needed for pain. Patient not taking: Reported on 09/14/2018 03/15/18   Georgina Quint, MD    Allergies  Allergen Reactions  . Shellfish Allergy Anaphylaxis and Swelling    All over body  . Chicken (Grilled) Flavor [Flavoring Agent] Swelling    All over body    Patient Active Problem List   Diagnosis Date Noted  . Chronic tension-type headache, not intractable 03/15/2018  . Essential hypertension 03/26/2010    Past Medical History:  Diagnosis Date  . Hypertension     Past Surgical History:  Procedure Laterality Date  . CESAREAN SECTION    . TUBAL LIGATION      Social History   Socioeconomic  History  . Marital status: Married    Spouse name: Not on file  . Number of children: Not on file  . Years of education: Not on file  . Highest education level: Not on file  Occupational History  . Not on file  Social Needs  . Financial resource strain: Not on file  . Food insecurity    Worry: Not on file    Inability: Not on file  . Transportation needs    Medical: Not on file    Non-medical: Not on file  Tobacco Use  . Smoking status: Never Smoker  . Smokeless tobacco: Never Used  Substance and Sexual Activity  . Alcohol use: No    Alcohol/week: 0.0 standard drinks  . Drug use: No  . Sexual activity: Not on file  Lifestyle  . Physical activity    Days per week: Not on file    Minutes per session: Not on file  . Stress: Not on file  Relationships  . Social Musician on phone: Not on file    Gets together: Not on file    Attends religious service: Not on file    Active member of club or organization: Not on file    Attends meetings of clubs or organizations: Not on file    Relationship status: Not on file  . Intimate partner violence    Fear of current or ex partner: Not on file  Emotionally abused: Not on file    Physically abused: Not on file    Forced sexual activity: Not on file  Other Topics Concern  . Not on file  Social History Narrative  . Not on file    History reviewed. No pertinent family history.   Review of Systems  Constitutional: Negative.  Negative for chills and fever.  HENT: Negative.  Negative for congestion and sore throat.   Eyes: Negative.   Respiratory: Negative.  Negative for cough and shortness of breath.   Cardiovascular: Negative.  Negative for chest pain and palpitations.  Gastrointestinal: Positive for heartburn (Chronic).  Genitourinary: Negative.  Negative for dysuria.  Musculoskeletal: Negative.  Negative for myalgias.  Skin: Negative.  Negative for rash.  Neurological: Positive for headaches (Chronic).  All  other systems reviewed and are negative.    Today's Vitals   09/14/18 0958  BP: 131/77  Pulse: (!) 53  Resp: 16  Temp: 98 F (36.7 C)  TempSrc: Oral  SpO2: 98%  Weight: 144 lb 6.4 oz (65.5 kg)   Body mass index is 30.18 kg/m.   Physical Exam Vitals signs reviewed.  Constitutional:      Appearance: Normal appearance.  HENT:     Head: Normocephalic and atraumatic.     Nose: Nose normal.     Mouth/Throat:     Mouth: Mucous membranes are moist.     Pharynx: Oropharynx is clear.  Eyes:     Extraocular Movements: Extraocular movements intact.     Conjunctiva/sclera: Conjunctivae normal.     Pupils: Pupils are equal, round, and reactive to light.  Neck:     Musculoskeletal: Normal range of motion and neck supple.  Cardiovascular:     Rate and Rhythm: Normal rate and regular rhythm.     Pulses: Normal pulses.     Heart sounds: Normal heart sounds.  Pulmonary:     Effort: Pulmonary effort is normal.     Breath sounds: Normal breath sounds.  Abdominal:     General: There is no distension.     Palpations: Abdomen is soft.     Tenderness: There is no abdominal tenderness.  Musculoskeletal: Normal range of motion.  Skin:    General: Skin is warm and dry.     Capillary Refill: Capillary refill takes less than 2 seconds.  Neurological:     General: No focal deficit present.     Mental Status: She is alert and oriented to person, place, and time.  Psychiatric:        Mood and Affect: Mood normal.        Behavior: Behavior normal.      ASSESSMENT & PLAN: [place] was seen today for medication refill and hypertension.  Diagnoses and all orders for this visit:  Essential hypertension -     lisinopril-hydrochlorothiazide (ZESTORETIC) 10-12.5 MG tablet; Take 1 tablet by mouth daily.  Chronic tension-type headache, not intractable  Chronic heartburn    Patient Instructions   Take Maalox or Mylanta as needed for heartburn. Only take Tylenol for headaches as needed.   Avoid NSAIDs such as Motrin, Advil, ibuprofen, or Aleve.    If you have lab work done today you will be contacted with your lab results within the next 2 weeks.  If you have not heard from [place] then please contact us. The fastest way to get your results is to register for My Chart.   IF you received an x-ray today, you will receive an invoice from [facility].  Please contact [facility] at [phone] with questions or concerns regarding your invoice.   IF you received labwork today, you will receive an invoice from [facility]. Please contact [facility] at [phone] with questions or concerns regarding your invoice.   Our billing staff will not be able to assist you with questions regarding bills from these companies.  You will be contacted with the lab results as soon as they are available. The fastest way to get your results is to activate your My Chart account. Instructions are located on the last page of this paperwork. If you have not heard from us regarding the results in 2 weeks, please contact this office.     ? nng Heartburn ? nng l m?t d?ng ?au ho?c c?m gic kh ch?u c th? x?y ra ? h?ng ho?c trong ng?c. N th??ng ???c m t? l ?au rt. N c?ng c th? gy ra vi? chua nh? axit trong mi?ng. ? nng c th? c?m th?y tr?m tr?ng h?n khi quy? vi? n?m xu?ng ho?c ci xu?ng v n th??ng n?ng h?n vo ban ?m. ? nng c th? xa?y ra do ca?c th?? trong d? dy di chuy?n ng???c ln th?c qu?n (tro ng??c). Tun th? nh?ng h??ng d?n ny ? nh: ?n v u?ng   Trnh m?t s? lo?i th?c ph?m v ?? u?ng nh?t ??nh theo ch? d?n c?a chuyn gia ch?m [place] s?c kh?e. Vi?c ny c th? bao g?m: ? C ph v tr (c ho?c khng c caffeine). ? ?? u?ng cr??u. ? ?? u?ng t?ng l?c v ?? u?ng dng trong th? thao. ? ?? u?ng c ga ho?c soda. ? S-c-la v cacao. ? B?c h v h??ng b?c h. ? T?i v hnh. ? C?i ng?a (Horseradish). ? Cc th?c ?n nhi?u gia v? v axit, bao g?m h?t tiu, b?t ?t, b?t cri,  gi?m, n??c s?t cay v n??c s?t th?t n??ng. ? N??c qu? ho?c qu? h? cam qut, ch?ng h?n nh? cam, chanh v chanh l cam. ? Cc th?c ?n c c chua, nh? n??c x?t ??, ?t, n??c x?t salsa v pizza km x?t ??. ? Th?c ?n chin v nhi?u ch?t bo, ch?ng h?n nh? bnh rn, khoai ty chin, khoai ty rn v n??c x?t nhi?u ch?t bo. ? Th?t nhi?u ch?t bo, ch?ng h?n nh? hot dog (bnh m k?p xc xch) v cc lo?i th?t ?? v tr?ng nhi?u m?, ch?ng h?n nh? th?t n?c l?ng, xc xch, gi?m bng v th?t l?n xng khi. ? Nh?ng s?n ph?m b? s?a giu ch?t bo, nh? s?a nguyn kem, b? v pho mt kem.  ?n cc b?a nh?, th??ng xuyn thay v cc b?a no.  Trnh u?ng nhi?u n??c khi ?n.  Trnh ?n trong kho?ng 2-3 gi? tr??c khi ?i ng?.  Trnh n?m xu?ng ngay sau khi ?n.  Khng t?p th? d?c ngay sau khi ?n. L?i s?ng      N?u qu v? th?a cn, hy gi?m cn v? m?c c l?i cho s?c kh?e c?a qu v?. Hy h?i chuyn gia ch?m [place] s?c kh?e ?? ???c h??ng d?n v? m?c tiu gi?m cn an ton.  Khng s? d?ng b?t k? s?n ph?m no c nicotine ho?c thu?c l, ch?ng ha?n nh? thu?c l d?ng ht, thu?c l ?i?n t? v thu?c l d?ng nhai. Nh?ng s?n ph?m ny lm cho tri?u ch?ng c?a qu v? tr?m tr?ng h?n. N?u qu v? c?n gip ?? ?? cai thu?c, hy h?i chuyn gia ch?m [place] s?c kh?e.  M?c qu?n o  r?ng. Imagene Sheller m?c ci g ch?t quanh eo m c th? t?o p l?c ln b?ng.  Nng (nng cao) ??u gi??ng c?a qu v? thm 6 ins? (15 cm) khi ng?.  C? g?ng gi?m c?ng th?ng, ch?ng h?n nh? t?p yoga ho?c thi?n. N?u qu v? c?n gip ?? ?? lm gi?m c?ng th?ng, hy h?i chuyn gia ch?m [place] s?c kh?e. H??ng d?n chung  Ch  ??n b?t c? thay ??i no v? tri?u ch?ng c?a qu v?.  Ch? s? d?ng thu?c khng k ??n v thu?c k ??n theo ch? d?n c?a chuyn gia ch?m [place] s?c kh?e. ? Khng dng aspirin, ibuprofen, ho?c cc thu?c ch?ng vim khng steroid (NSAID) khc tr? khi chuyn gia ch?m [place] s?c kh?e c?a qu v? cho php. ? Ng?ng s? d?ng thu?c theo h??ng d?n c?a chuyn gia ch?m [place] s?c kh?e cu?a quy?  vi?. N?u qu v? ng?ng dng m?t s? lo?i thu?c qu nhanh, cc tri?u ch?ng c?a qu v? c th? tr?m tr?ng h?n.  Tun th? t?t c? cc l?n khm theo di theo ch? d?n c?a chuyn gia ch?m [place] s?c kh?e. ?i?u ny c vai tr quan tr?ng. Hy lin l?c v?i chuyn gia ch?m [place] s?c kh?e n?u:  Qu v? c cc tri?u ch?ng m?i.  Qu v? b? s?t cn khng r nguyn nhn.  Qu v? b? kh nu?t ho?c b? ?au khi nu?t.  Qu v? th? kh kh ho?c ho dai d?ng.  Cc tri?u ch?ng c?a qu v? khng c?i thi?n sau khi ???c ?i?u tr?[place]ell Heads v? bi? ? nng th??ng xuyn trong h?n 2 tu?n. Yu c?u tr? gip ngay l?p t?c n?u:  Qu v? b? ?au ? cnh tay, c?, hm, r?ng ho?c l?ng.  Qu v? th?y ?? m? hi, chng m?t ho?c chong vng.  Qu v? b? ?au ng?c ho?c kh th?.  Qu v? nn v ch?t nn ra gi?ng nh? mu ho?c b c ph.  Phn c?a qu v? c mu ho?c mu ?en. Nh?ng tri?u ch?ng ny c th? l bi?u hi?n c?a m?t v?n ?? nghim tr?ng c?n c?p c?u. Khng ch? xem tri?u ch?ng c h?t khng. Hy ?i khm ngay l?p t?c. G?i cho d?ch v? c?p c?u t?i ??a ph??ng (911 ? [place]?). Khng t? li xe ??n b?nh vi?n. Tm t?t  ? nng l m?t d?ng ?au ho?c c?m gic kh ch?u c th? x?y ra ? h?ng ho?c trong ng?c. N th??ng ???c m t? l ?au rt. N c?ng c th? gy ra vi? chua nh? axit trong mi?ng.  [place] m?t s? lo?i th?c ph?m v ?? u?ng nh?t ??nh theo ch? d?n c?a chuyn gia ch?m [place] s?c kh?e.  Ch? s? d?ng thu?c khng k ??n v thu?c k ??n theo ch? d?n c?a chuyn gia ch?m [place] s?c kh?e. Khng dng aspirin, ibuprofen, ho?c cc thu?c ch?ng vim khng steroid (NSAID) khc tr? khi chuyn gia ch?m [place] s?c kh?e c?a qu v? cho php.  Lin h? v?i chuyn gia ch?m [place] s?c kh?e n?u cc tri?u ch?ng c?a qu v? khng c?i thi?n ho?c tr?m tr?ng h?n. Thng tin ny khng nh?m m?c ?ch thay th? cho l?i khuyn m chuyn gia ch?m [place] s?c kh?e ni v?i qu v?. Hy b?o ??m qu v? ph?i th?o lu?n b?t k? v?n ?? g m qu v? c v?i chuyn gia ch?m [place] s?c kh?e c?a qu v?. Document Released:  05/27/2015 Document Revised: 08/11/2017 Document Reviewed: 08/11/2017 Elsevier Patient Education  2020 [employer].  T?ng huy?t p, Ng??i l?n Hypertension, Adult Huy?t p cao (t?ng huy?t p) l khi l?c b?m mu qua ??ng m?ch qu m?nh. ??ng m?ch l cc m?ch mu mang mu t? tim ?i kh?p c? th?. T?ng huy?t p khi?n tim lm vi?c v?t v? h?n ?? b?m mu v c th? khi?n cc ??ng m?ch tr? ln h?p ho?c c?ng. T?ng huy?t p khng ???c ?i?u tr? ho?c ki?m sot c th? d?n t?i nh?i mu c? tim, suy tim, ??t qu?, b?nh th?n v nh?ng v?n ?? khc. Ch? s? ?o huy?t p g?m m?t ch? s? cao trn m?t ch? s? th?p. L t??ng l huy?t a?p cu?a quy? vi? c?n ph?i d??i 120/80. Ch? s? ??u tin ("??nh") ???c g?i l huy?t p tm thu. ?y l s? ?o p su?t trong ??ng m?ch khi tim qu v? ??p. Ch? s? th? hai ("?y") ???c g?i l huy?t p tm tr??ng. ?y l s? ?o p su?t trong ??ng m?ch khi tim qu v? ngh?Al Decant. Nguyn nhn g gy ra? Khng r nguyn nhn chnh xc gy ra tnh tr?ng ny. C m?t s? tnh tr?ng d?n ??n ho?c c lin quan ??n huy?t p cao. ?i?u g lm t?ng nguy c?? M?t s? y?u t? nguy c? d?n ??n huy?t p cao c th? ki?m sot ???c. Nh?ng y?u t? sau c th? lm qu v? d? b? tnh tr?ng ny h?n:  Ht thu?c.  B? b?nh ti?u ???ng tup 2, cholesterol cao, ho?c c? hai.  Khng t?p th? d?c ho?c cc ho?t ??ng th? ch?t ??y ??Marland Kitchen.  Th?a cn.  ?n qu nhi?u ch?t bo, ???ng, ca-lo, ho?c mu?i (Natri).  U?ng qu nhi?u r??u. M?t s? y?u t? nguy c? b? huy?t p cao c th? kh ho?c khng th? thay ??i. M?t s? y?u t? trong s? ny bao g?m:  B?nh th?n m?n tnh.  C ti?n s? gia ?nh b? cao huy?t p.  ?? tu?i. Nguy c? t?ng ln theo ?? tu?i.  Ch?ng t?c. Qu v? c nguy c? cao h?n n?u qu v? l ng??i M? g?c Phi.  Gi?i tnh. Nam gi?i c nguy c? cao h?n ph? n? tr??c tu?i 46. Sau tu?i 66, ph? n? c nguy c? cao h?n nam gi?i.  Ng?ng th? khi ng? do t?c ngh?n.  C?ng th?ng. Cc d?u hi?u ho?c tri?u ch?ng g? Huy?t p cao c th? khng gy ra cc tri?u ch?ng.  Huy?t p r?t cao (c?n t?ng huy?t p) c th? gy ra:  ?au ??u.  Lo u.  Kh th?.  Ch?y mu cam.  Bu?n nn v nn m?a.  Th? l?c thay ??i.  ?au ng?c d? d?i.  Co gi?t. Ch?n ?on tnh tr?ng ny nh? th? no? Tnh tr?ng ny ???c ch?n ?on b?ng cch ?o huy?t p c?a qu v? lc qu v? ng?i, ?? tay trn m?t b? m?t ph?ng, hai chn khng b?t cho, v hai bn chn b?ng ph?ng trn sn. B?ng qu?n thi?t b? ?o huy?t p s? ???c qu?n tr?c ti?p vo vng da cnh tay pha trn c?a qu v? ngang v?i m?c tim. Huy?t p c?n ???c ?o t nh?t hai l?n trn cng m?t cnh tay. M?t s? tnh tr?ng nh?t ??nh c th? lm cho huy?t p khc nhau gi?a tay ph?i v tay tri c?a qu v?. M?t s? y?u t? nh?t ??nh c th? khi?n ch? s? ?o huy?t p th?p h?n ho?c cao h?n so v?i bnh th??ng trong th?i gian ng?n:  Khi huy?t p c?a qu v? ?  phng khm c?a chuyn gia ch?m [place] s?c kh?e cao h?n so v?i lc qu v? ? nh, hi?n t??ng ny ???c g?i l t?ng huy?t p o chong tr?ng. H?u h?t nh?ng ng??i b? tnh tr?ng ny ??u khng c?n dng thu?c.  Khi huy?t p c?a qu v? lc ? nh cao h?n so v?i lc qu v? ? phng khm chuyn gia ch?m [place] s?c kh?e, hi?n t??ng ny ???c g?i l t?ng huy?t p ?n. H?u h?t nh?ng ng??i b? tnh tr?ng ny ??u c th? c?n dng thu?c ?? ki?m sot huy?t p. N?u qu v? c ch? s? huy?t p cao trong m?t l?n khm ho?c qu v? c huy?t p bnh th??ng c km cc y?u t? nguy c? khc, qu v? c th? ???c yu c?u:  Tr? l?i vo m?t ngy khc ?? ki?m tra l?i huy?t p.  Theo di huy?t p t?i nh trong vng 1 tu?n ho?c lu h?n. N?u qu v? ???c ch?n ?on b? t?ng huy?t p, qu v? c th? c?n th?c hi?n cc xt nghi?m mu ho?c ki?m tra hnh ?nh khc ?? gip chuyn gia ch?m [place] s?c kh?e hi?u nguy c? t?ng th? m?c cc b?nh tr?ng khc. Tnh tr?ng ny ???c ?i?u tr? nh? th? no? Tnh tr?ng ny ???c ?i?u tr? b?ng cch thay ??i l?i s?ng lnh m?nh, ch?ng h?nh nh? ?n th?c ph?m c l?i cho s?c kh?e, t?p th? d?c nhi?u h?n v gi?m l??ng r??u u?ng vo. N?u thay ??i  l?i s?ng khng ?? ?? ??a huy?t p v? m?c c th? ki?m sot ???c, chuyn gia ch?m [place] s?c kh?e c th? k ??n thu?c, v n?u:  Huy?t p tm thu c?a qu v? trn 130.  Huy?t p tm tr??ng c?a qu v? trn 80. Huy?t p m?c tiu c nhn c?a qu v? c th? khc nhau ty thu?c v tnh tr?ng b?nh l, tu?i v cc nhn t? khc. Tun th? nh?ng h??ng d?n ny ? nh: ?n v u?ng   ?n ch? ?? giu ch?t x? v kali v t natri, ???ng ph? gia v ch?t bo. M?t k? ho?ch ?n m?u c tn l ch? ?? ?n DASH (Cch ti?p c?n ch? ?? ?n u?ng ?? ng?n ch?n t?ng huy?t p). ?n theo cch ny: ? ?n nhi?u tri cy v rau t??i. Vo m?i b?a ?n, c? g?ng dnh m?t n?a ??a cho tri cy v rau c?. ? ?n ng? c?c nguyn cm, ch?ng h?n nh? m ?ng lm t? b?t m nguyn cm, g?o l?t, ho?c bnh m t? b?t m nguyn cm. Cho ngu? c?c nguyn ca?m va?o kho?ng m?t ph?n t? ??a. ? ?n ho?c hu?ng cc s?n ph?m t? s?a t bo, ch?ng h?n nh? s?a ? b? kem ho?c s?a chua t bo. ? Trnh nh?ng mi?ng th?t nhi?u m?, th?t ? qua ch? bi?n ho?c th?t ??p mu?i v th?t gia c?m c da. Dnh kho?ng m?t ph?n t? ??a c?a qu v? cho cc protein khng m?, ch?ng h?n nh? c, th?t g khng da, ??u, tr?ng, ho?c ??u ph?. ? Trnh nh?ng th?c ph?m ch? bi?n ho?c lm s?n. Nh?ng th?c ph?m ny th??ng c nhi?u natri, ???ng ph? gia v ch?t bo h?n.  Gi?m l??ng dng natri hng ngy c?a qu v?. H?u h?t nh?ng ng??i b? t?ng huy?t p ??u nn ?n d??i 1.500 mg natri m?i ngy.  Khng u?ng r??u n?u: ? Chuyn gia ch?m  s?c kh?e c?a qu v? khuyn qu v? khng u?ng r??u. ? Qu v? c [place],  c th? c [place], ho?c c k? ho?ch c [place].  N?u qu v? u?ng r??u: ? Gi?i h?n l??ng r??u qu v? u?ng ? m?c:  0-1 ly/ngy ??i v?i n? gi?i.  0-2 ly/ngy ??i v?i nam gi?i. ? Bi?t r m?t ly c bao nhiu r??u. ? M?, m?t ly t??ng ???ng v?i m?t chai bia 12 ao x? (355 mL), m?t ly r??u vang 5 ao x? (148 mL), ho?c m?t ly r??u m?nh 1 ao x? (44 mL). L?i s?ng   H?p tc v?i chuyn gia ch?m [place] s?c kh?e c?a qu v? ?? duy tr  tr?ng l??ng c? th? c l?i cho s?c kh?e ho?c ?? gi?m cn. Hy h?i xem tr?ng l??ng no l l t??ng cho qu v?.  T?p th? d?c t nh?t 30 pht h?u h?t cc ngy trong tu?n. Cc ho?t ??ng c th? bao g?m ?i b?, b?i, ho?c ??p xe.  Bao g?m bi t?p t?ng c??ng c? (bi t?p khng l?c), ch?ng h?n nh? bi t?p [place] ho?c nng t?, nh? m?t ph?n c?a thi quen luy?n t?p hng tu?n c?a qu v?. C? g?ng t?p nh?ng lo?i bi t?p ny trong vng 30 pht t?i thi?u 3 ngy m?i tu?n.  Khng s? d?ng b?t k? s?n ph?m no c nicotine ho?c thu?c l, ch?ng ha?n nh? thu?c l d?ng ht, thu?c l ?i?n t? v thu?c l d?ng nhai. N?u qu v? c?n gip ?? ?? cai thu?c, hy h?i chuyn gia ch?m [place] s?c kh?e.  Theo di huy?t p c?a qu v? t?i nh theo h??ng d?n c?a chuyn gia ch?m [place] s?c kh?e.  Tun th? t?t c? cc l?n khm theo di theo ch? d?n c?a chuyn gia ch?m [place] s?c kh?e. ?i?u ny c vai tr quan tr?ng. Thu?c  Ch? s? d?ng thu?c khng k ??n v thu?c k ??n theo ch? d?n c?a chuyn gia ch?m [place] s?c kh?e. Lm theo ch? d?n m?t cch c?n th?n. Thu?c ?i?u tr? huy?t p ph?i ???c dng theo ??n ? k.  Khng b? cc li?u thu?c huy?t p. Vi?c b? dng thu?c khi?n qu v? c nguy c? g?p ph?i cc v?n ?? v c th? lm cho thu?c gi?m hi?u qu?Marland Kitchen  Hy h?i chuyn gia ch?m [place] s?c kh?e c?a qu v? v? nh?ng tc d?ng ph? ho?c ph?n ?ng v?i thu?c m qu v? ph?i theo di. Hy lin l?c v?i chuyn gia ch?m [place] s?c kh?e n?u qu v?:  Ngh? qu v? c ph?n ?ng v?i thu?c ?ang dng.  B? ?au ??u ti?p t?c tr? l?i (ti pht).  C?m th?y chng m?t.  B? s?ng ph ? c? chn.  C v?n ?? v? th? l?c. Yu c?u tr? gip ngay l?p t?c n?u qu v?:  B? ?au ??u r?t nhi?u ho?c l l?n.  B? y?u ho?c t b b?t th??ng.  C?m th?y ng?t x?u.  B? ?au r?t nhi?u ? ng?c ho?c b?ng.  Nn nhi?u l?n.  B? kh th?. Tm t?t  T?ng huy?t p l khi l?c b?m mu qua cc ??ng m?ch c?a qu v? qu m?nh. N?u tnh tr?ng ny khng ???c ki?m sot, n c th? khi?n qu v? c nguy c? v? cc bi?n ch?ng  nghim tr?ng.  Huy?t p m?c tiu c nhn c?a qu v? c th? khc nhau ty thu?c v tnh tr?ng b?nh l, tu?i v cc nhn t? khc. ??i v?i h?u h?t m?i ng??i, huy?t p bnh th??ng l d??i 120/80.  ?i?u tr? t?ng huy?t p b?ng cch thay ??i  l?i s?ng, dng thu?c, ho?c k?t h?p c? hai. Thay ??i l?i s?ng bao g?m gi?m cn, ?n ch? ?? ?n c l?i cho s?c kh?e, t natri, t?p th? d?c nhi?u h?n v h?n ch? u?ng r??u. Thng tin ny khng nh?m m?c ?ch thay th? cho l?i khuyn m chuyn gia ch?m [place] s?c kh?e ni v?i qu v?. Hy b?o ??m qu v? ph?i th?o lu?n b?t k? v?n ?? g m qu v? c v?i chuyn gia ch?m [place] s?c kh?e c?a qu v?. Document Released: 02/02/2005 Document Revised: 11/12/2017 Document Reviewed: 11/12/2017 [employer] Patient Education  2020 [employer]      Edwina BarthMiguel Kyrillos Adams, MD [facility]

## 2018-09-14 NOTE — Patient Instructions (Addendum)
Take Maalox or Mylanta as needed for heartburn. Only take Tylenol for headaches as needed.  Avoid NSAIDs such as Motrin, Advil, ibuprofen, or Aleve.    If you have lab work done today you will be contacted with your lab results within the next 2 weeks.  If you have not heard from us then please contact us. The fastest way to get your results is to register for My Chart.   IF you received an x-ray today, you will receive an invoice from [facility]. Please contact [facility] at [phone] with questions or concerns regarding your invoice.   IF you received labwork today, you will receive an invoice from [facility]. Please contact [facility] at [phone] with questions or concerns regarding your invoice.   Our billing staff will not be able to assist you with questions regarding bills from these companies.  You will be contacted with the lab results as soon as they are available. The fastest way to get your results is to activate your My Chart account. Instructions are located on the last page of this paperwork. If you have not heard from us regarding the results in 2 weeks, please contact this office.     ? nng Heartburn ? nng l m?t d?ng ?au ho?c c?m gic kh ch?u c th? x?y ra ? h?ng ho?c trong ng?c. N th??ng ???c m t? l ?au rt. N c?ng c th? gy ra vi? chua nh? axit trong mi?ng. ? nng c th? c?m th?y tr?m tr?ng h?n khi quy? vi? n?m xu?ng ho?c ci xu?ng v n th??ng n?ng h?n vo ban ?m. ? nng c th? xa?y ra do ca?c th?? trong d? dy di chuy?n ng???c ln th?c qu?n (tro ng??c). Tun th? nh?ng h??ng d?n ny ? nh: ?n v u?ng   Trnh m?t s? lo?i th?c ph?m v ?? u?ng nh?t ??nh theo ch? d?n c?a chuyn gia ch?m [place] s?c kh?e. Vi?c ny c th? bao g?m: ? C ph v tr (c ho?c khng c caffeine). ? ?? u?ng cr??u. ? ?? u?ng t?ng l?c v ?? u?ng dng trong th? thao. ? ?? u?ng c ga ho?c soda. ? S-c-la v cacao. ? B?c h v h??ng b?c h. ? T?i v hnh. ? C?i  ng?a (Horseradish). ? Cc th?c ?n nhi?u gia v? v axit, bao g?m h?t tiu, b?t ?t, b?t cri, gi?m, n??c s?t cay v n??c s?t th?t n??ng. ? N??c qu? ho?c qu? h? cam qut, ch?ng h?n nh? cam, chanh v chanh l cam. ? Cc th?c ?n c c chua, nh? n??c x?t ??, ?t, n??c x?t salsa v pizza km x?t ??. ? Th?c ?n chin v nhi?u ch?t bo, ch?ng h?n nh? bnh rn, khoai ty chin, khoai ty rn v n??c x?t nhi?u ch?t bo. ? Th?t nhi?u ch?t bo, ch?ng h?n nh? hot dog (bnh m k?p xc xch) v cc lo?i th?t ?? v tr?ng nhi?u m?, ch?ng h?n nh? th?t n?c l?ng, xc xch, gi?m bng v th?t l?n xng khi. ? Nh?ng s?n ph?m b? s?a giu ch?t bo, nh? s?a nguyn kem, b? v pho mt kem.  ?n cc b?a nh?, th??ng xuyn thay v cc b?a no.  Trnh u?ng nhi?u n??c khi ?n.  Trnh ?n trong kho?ng 2-3 gi? tr??c khi ?i ng?.  Trnh n?m xu?ng ngay sau khi ?n.  Khng t?p th? d?c ngay sau khi ?n. L?i s?ng      N?u qu v? th?a cn, hy gi?m cn v? m?c c l?i cho  s?c kh?e c?a qu v?. Hy h?i chuyn gia ch?m [place] s?c kh?e ?? ???c h??ng d?n v? m?c tiu gi?m cn an ton.  Khng s? d?ng b?t k? s?n ph?m no c nicotine ho?c thu?c l, ch?ng ha?n nh? thu?c l d?ng ht, thu?c l ?i?n t? v thu?c l d?ng nhai. Nh?ng s?n ph?m ny lm cho tri?u ch?ng c?a qu v? tr?m tr?ng h?n. N?u qu v? c?n gip ?? ?? cai thu?c, hy h?i chuyn gia ch?m [place] s?c kh?e.  M?c qu?n o r?ng. Khng m?c ci g ch?t quanh eo m c th? t?o p l?c ln b?ng.  Nng (nng cao) ??u gi??ng c?a qu v? thm 6 ins? (15 cm) khi ng?.  C? g?ng gi?m c?ng th?ng, ch?ng h?n nh? t?p yoga ho?c thi?n. N?u qu v? c?n gip ?? ?? lm gi?m c?ng th?ng, hy h?i chuyn gia ch?m [place] s?c kh?e. H??ng d?n chung  Ch  ??n b?t c? thay ??i no v? tri?u ch?ng c?a qu v?.  Ch? s? d?ng thu?c khng k ??n v thu?c k ??n theo ch? d?n c?a chuyn gia ch?m [place] s?c kh?e. ? Khng dng aspirin, ibuprofen, ho?c cc thu?c ch?ng vim khng steroid (NSAID) khc tr? khi chuyn gia ch?m [place] s?c kh?e c?a qu  v? cho php. ? Ng?ng s? d?ng thu?c theo h??ng d?n c?a chuyn gia ch?m [place] s?c kh?e cu?a quy? vi?. N?u qu v? ng?ng dng m?t s? lo?i thu?c qu nhanh, cc tri?u ch?ng c?a qu v? c th? tr?m tr?ng h?n.  Tun th? t?t c? cc l?n khm theo di theo ch? d?n c?a chuyn gia ch?m [place] s?c kh?e. ?i?u ny c vai tr quan tr?ng. Hy lin l?c v?i chuyn gia ch?m [place] s?c kh?e n?u:  Qu v? c cc tri?u ch?ng m?i.  Qu v? b? s?t cn khng r nguyn nhn.  Qu v? b? kh nu?t ho?c b? ?au khi nu?t.  Qu v? th? kh kh ho?c ho dai d?ng.  Cc tri?u ch?ng c?a qu v? khng c?i thi?n sau khi ???c ?i?u tr?[place] v? bi? ? nng th??ng xuyn trong h?n 2 tu?n. Yu c?u tr? gip ngay l?p t?c n?u:  Qu v? b? ?au ? cnh tay, c?, hm, r?ng ho?c l?ng.  Qu v? th?y ?? m? hi, chng m?t ho?c chong vng.  Qu v? b? ?au ng?c ho?c kh th?.  Qu v? nn v ch?t nn ra gi?ng nh? mu ho?c b c ph.  Phn c?a qu v? c mu ho?c mu ?en. Nh?ng tri?u ch?ng ny c th? l bi?u hi?n c?a m?t v?n ?? nghim tr?ng c?n c?p c?u. Khng ch? xem tri?u ch?ng c h?t khng. Hy ?i khm ngay l?p t?c. G?i cho d?ch v? c?p c?u t?i ??a ph??ng (911 ? [place] Khng t? li xe ??n b?nh vi?n. Tm t?t  ? nng l m?t d?ng ?au ho?c c?m gic kh ch?u c th? x?y ra ? h?ng ho?c trong ng?c. N th??ng ???c m t? l ?au rt. N c?ng c th? gy ra vi? chua nh? axit trong mi?ng.  [place] m?t s? lo?i th?c ph?m v ?? u?ng nh?t ??nh theo ch? d?n c?a chuyn gia ch?m [place] s?c kh?e.  Ch? s? d?ng thu?c khng k ??n v thu?c k ??n theo ch? d?n c?a chuyn gia ch?m [place] s?c kh?e. Khng dng aspirin, ibuprofen, ho?c cc thu?c ch?ng vim khng steroid (NSAID) khc tr? khi chuyn gia ch?m [place] s?c kh?e c?a qu v? cho php.  Lin h?  v?i chuyn gia ch?m [place] s?c kh?e n?u cc tri?u ch?ng c?a qu v? khng c?i thi?n ho?c tr?m tr?ng h?n. Thng tin ny khng nh?m m?c ?ch thay th? cho l?i khuyn m chuyn gia ch?m [place] s?c kh?e ni v?i qu v?. Hy b?o ??m qu v? ph?i th?o lu?n b?t k?  v?n ?? g m qu v? c v?i chuyn gia ch?m [place] s?c kh?e c?a qu v?. Document Released: 05/27/2015 Document Revised: 08/11/2017 Document Reviewed: 08/11/2017 Elsevier Patient Education  2020 Elsevier Inc.  T?ng huy?t p, Ng??i l?n Hypertension, Adult Huy?t p cao (t?ng huy?t p) l khi l?c b?m mu qua ??ng m?ch qu m?nh. ??ng m?ch l cc m?ch mu mang mu t? tim ?i kh?p c? th?. T?ng huy?t p khi?n tim lm vi?c v?t v? h?n ?? b?m mu v c th? khi?n cc ??ng m?ch tr? ln h?p ho?c c?ng. T?ng huy?t p khng ???c ?i?u tr? ho?c ki?m sot c th? d?n t?i nh?i mu c? tim, suy tim, ??t qu?, b?nh th?n v nh?ng v?n ?? khc. Ch? s? ?o huy?t p g?m m?t ch? s? cao trn m?t ch? s? th?p. L t??ng l huy?t a?p cu?a quy? vi? c?n ph?i d??i 120/80. Ch? s? ??u tin ("??nh") ???c g?i l huy?t p tm thu. ?y l s? ?o p su?t trong ??ng m?ch khi tim qu v? ??p. Ch? s? th? hai ("?y") ???c g?i l huy?t p tm tr??ng. ?y l s? ?o p su?t trong ??ng m?ch khi tim qu v? ngh?[place]. Nguyn nhn g gy ra? Khng r nguyn nhn chnh xc gy ra tnh tr?ng ny. C m?t s? tnh tr?ng d?n ??n ho?c c lin quan ??n huy?t p cao. ?i?u g lm t?ng nguy c?? M?t s? y?u t? nguy c? d?n ??n huy?t p cao c th? ki?m sot ???c. Nh?ng y?u t? sau c th? lm qu v? d? b? tnh tr?ng ny h?n:  Ht thu?c.  B? b?nh ti?u ???ng tup 2, cholesterol cao, ho?c c? hai.  Khng t?p th? d?c ho?c cc ho?t ??ng th? ch?t ??y ??Marland Kitchen.  Th?a cn.  ?n qu nhi?u ch?t bo, ???ng, ca-lo, ho?c mu?i (Natri).  U?ng qu nhi?u r??u. M?t s? y?u t? nguy c? b? huy?t p cao c th? kh ho?c khng th? thay ??i. M?t s? y?u t? trong s? ny bao g?m:  B?nh th?n m?n tnh.  C ti?n s? gia ?nh b? cao huy?t p.  ?? tu?i. Nguy c? t?ng ln theo ?? tu?i.  Ch?ng t?c. Qu v? c nguy c? cao h?n n?u qu v? l ng??i M? g?c Phi.  Gi?i tnh. Nam gi?i c nguy c? cao h?n ph? n? tr??c tu?i 45. Sau tu?i 66, ph? n? c nguy c? cao h?n nam gi?i.  Ng?ng th? khi ng? do t?c ngh?n.  C?ng  th?ng. Cc d?u hi?u ho?c tri?u ch?ng g? Huy?t p cao c th? khng gy ra cc tri?u ch?ng. Huy?t p r?t cao (c?n t?ng huy?t p) c th? gy ra:  ?au ??u.  Lo u.  Kh th?.  Ch?y mu cam.  Bu?n nn v nn m?a.  Th? l?c thay ??i.  ?au ng?c d? d?i.  Co gi?t. Ch?n ?on tnh tr?ng ny nh? th? no? Tnh tr?ng ny ???c ch?n ?on b?ng cch ?o huy?t p c?a qu v? lc qu v? ng?i, ?? tay trn m?t b? m?t ph?ng, hai chn khng b?t cho, v hai bn chn b?ng ph?ng trn sn. B?ng qu?n thi?t b? ?o huy?t p s? ???c  qu?n tr?c ti?p vo vng da cnh tay pha trn c?a qu v? ngang v?i m?c tim. Huy?t p c?n ???c ?o t nh?t hai l?n trn cng m?t cnh tay. M?t s? tnh tr?ng nh?t ??nh c th? lm cho huy?t p khc nhau gi?a tay ph?i v tay tri c?a qu v?. M?t s? y?u t? nh?t ??nh c th? khi?n ch? s? ?o huy?t p th?p h?n ho?c cao h?n so v?i bnh th??ng trong th?i gian ng?n:  Khi huy?t p c?a qu v? ? phng khm c?a chuyn gia ch?m [place] s?c kh?e cao h?n so v?i lc qu v? ? nh, hi?n t??ng ny ???c g?i l t?ng huy?t p o chong tr?ng. H?u h?t nh?ng ng??i b? tnh tr?ng ny ??u khng c?n dng thu?c.  Khi huy?t p c?a qu v? lc ? nh cao h?n so v?i lc qu v? ? phng khm chuyn gia ch?m [place] s?c kh?e, hi?n t??ng ny ???c g?i l t?ng huy?t p ?n. H?u h?t nh?ng ng??i b? tnh tr?ng ny ??u c th? c?n dng thu?c ?? ki?m sot huy?t p. N?u qu v? c ch? s? huy?t p cao trong m?t l?n khm ho?c qu v? c huy?t p bnh th??ng c km cc y?u t? nguy c? khc, qu v? c th? ???c yu c?u:  Tr? l?i vo m?t ngy khc ?? ki?m tra l?i huy?t p.  Theo di huy?t p t?i nh trong vng 1 tu?n ho?c lu h?n. N?u qu v? ???c ch?n ?on b? t?ng huy?t p, qu v? c th? c?n th?c hi?n cc xt nghi?m mu ho?c ki?m tra hnh ?nh khc ?? gip chuyn gia ch?m [place] s?c kh?e hi?u nguy c? t?ng th? m?c cc b?nh tr?ng khc. Tnh tr?ng ny ???c ?i?u tr? nh? th? no? Tnh tr?ng ny ???c ?i?u tr? b?ng cch thay ??i l?i s?ng lnh m?nh, ch?ng h?nh nh? ?n  th?c ph?m c l?i cho s?c kh?e, t?p th? d?c nhi?u h?n v gi?m l??ng r??u u?ng vo. N?u thay ??i l?i s?ng khng ?? ?? ??a huy?t p v? m?c c th? ki?m sot ???c, chuyn gia ch?m [place] s?c kh?e c th? k ??n thu?c, v n?u:  Huy?t p tm thu c?a qu v? trn 130.  Huy?t p tm tr??ng c?a qu v? trn 80. Huy?t p m?c tiu c nhn c?a qu v? c th? khc nhau ty thu?c v tnh tr?ng b?nh l, tu?i v cc nhn t? khc. Tun th? nh?ng h??ng d?n ny ? nh: ?n v u?ng   ?n ch? ?? giu ch?t x? v kali v t natri, ???ng ph? gia v ch?t bo. M?t k? ho?ch ?n m?u c tn l ch? ?? ?n DASH (Cch ti?p c?n ch? ?? ?n u?ng ?? ng?n ch?n t?ng huy?t p). ?n theo cch ny: ? ?n nhi?u tri cy v rau t??i. Vo m?i b?a ?n, c? g?ng dnh m?t n?a ??a cho tri cy v rau c?. ? ?n ng? c?c nguyn cm, ch?ng h?n nh? m ?ng lm t? b?t m nguyn cm, g?o l?t, ho?c bnh m t? b?t m nguyn cm. Cho ngu? c?c nguyn ca?m va?o kho?ng m?t ph?n t? ??a. ? ?n ho?c hu?ng cc s?n ph?m t? s?a t bo, ch?ng h?n nh? s?a ? b? kem ho?c s?a chua t bo. ? Trnh nh?ng mi?ng th?t nhi?u m?, th?t ? qua ch? bi?n ho?c th?t ??p mu?i v th?t gia c?m c da. Dnh kho?ng m?t ph?n t? ??a c?a qu v? cho cc protein khng m?, ch?ng h?n  nh? c, th?t g khng da, ??u, tr?ng, ho?c ??u ph?. ? Trnh nh?ng th?c ph?m ch? bi?n ho?c lm s?n. Nh?ng th?c ph?m ny th??ng c nhi?u natri, ???ng ph? gia v ch?t bo h?n.  Gi?m l??ng dng natri hng ngy c?a qu v?. H?u h?t nh?ng ng??i b? t?ng huy?t p ??u nn ?n d??i 1.500 mg natri m?i ngy.  Khng u?ng r??u n?u: ? Chuyn gia ch?m [place] s?c kh?e c?a qu v? khuyn qu v? khng u?ng r??u. ? Qu v? c [place], c th? c [place], ho?c c k? ho?ch c [place].  N?u qu v? u?ng r??u: ? Gi?i h?n l??ng r??u qu v? u?ng ? m?c:  0-1 ly/ngy ??i v?i n? gi?i.  0-2 ly/ngy ??i v?i nam gi?i. ? Bi?t r m?t ly c bao nhiu r??u. ? M?, m?t ly t??ng ???ng v?i m?t chai bia 12 ao x? (355 mL), m?t ly r??u vang 5 ao x? (148 mL), ho?c m?t ly r??u m?nh 1  ao x? (44 mL). L?i s?ng   H?p tc v?i chuyn gia ch?m [place] s?c kh?e c?a qu v? ?? duy tr tr?ng l??ng c? th? c l?i cho s?c kh?e ho?c ?? gi?m cn. Hy h?i xem tr?ng l??ng no l l t??ng cho qu v?.  T?p th? d?c t nh?t 30 pht h?u h?t cc ngy trong tu?n. Cc ho?t ??ng c th? bao g?m ?i b?, b?i, ho?c ??p xe.  Bao g?m bi t?p t?ng c??ng c? (bi t?p khng l?c), ch?ng h?n nh? bi t?p [place] ho?c nng t?, nh? m?t ph?n c?a thi quen luy?n t?p hng tu?n c?a qu v?. C? g?ng t?p nh?ng lo?i bi t?p ny trong vng 30 pht t?i thi?u 3 ngy m?i tu?n.  Khng s? d?ng b?t k? s?n ph?m no c nicotine ho?c thu?c l, ch?ng ha?n nh? thu?c l d?ng ht, thu?c l ?i?n t? v thu?c l d?ng nhai. N?u qu v? c?n gip ?? ?? cai thu?c, hy h?i chuyn gia ch?m [place] s?c kh?e.  Theo di huy?t p c?a qu v? t?i nh theo h??ng d?n c?a chuyn gia ch?m [place] s?c kh?e.  Tun th? t?t c? cc l?n khm theo di theo ch? d?n c?a chuyn gia ch?m [place] s?c kh?e. ?i?u ny c vai tr quan tr?ng. Thu?c  Ch? s? d?ng thu?c khng k ??n v thu?c k ??n theo ch? d?n c?a chuyn gia ch?m [place] s?c kh?e. Lm theo ch? d?n m?t cch c?n th?n. Thu?c ?i?u tr? huy?t p ph?i ???c dng theo ??n ? k.  Khng b? cc li?u thu?c huy?t p. Vi?c b? dng thu?c khi?n qu v? c nguy c? g?p ph?i cc v?n ?? v c th? lm cho thu?c gi?m hi?u qu?Marland Kitchen  Hy h?i chuyn gia ch?m [place] s?c kh?e c?a qu v? v? nh?ng tc d?ng ph? ho?c ph?n ?ng v?i thu?c m qu v? ph?i theo di. Hy lin l?c v?i chuyn gia ch?m [place] s?c kh?e n?u qu v?:  Ngh? qu v? c ph?n ?ng v?i thu?c ?ang dng.  B? ?au ??u ti?p t?c tr? l?i (ti pht).  C?m th?y chng m?t.  B? s?ng ph ? c? chn.  C v?n ?? v? th? l?c. Yu c?u tr? gip ngay l?p t?c n?u qu v?:  B? ?au ??u r?t nhi?u ho?c l l?n.  B? y?u ho?c t b b?t th??ng.  C?m th?y ng?t x?u.  B? ?au r?t nhi?u ? ng?c ho?c b?ng.  Nn nhi?u l?n.  B? kh th?. Tm t?t  T?ng huy?t  p l khi l?c b?m mu qua cc ??ng m?ch c?a qu v? qu m?nh. N?u  tnh tr?ng ny khng ???c ki?m sot, n c th? khi?n qu v? c nguy c? v? cc bi?n ch?ng nghim tr?ng.  Huy?t p m?c tiu c nhn c?a qu v? c th? khc nhau ty thu?c v tnh tr?ng b?nh l, tu?i v cc nhn t? khc. ??i v?i h?u h?t m?i ng??i, huy?t p bnh th??ng l d??i 120/80.  ?i?u tr? t?ng huy?t p b?ng cch thay ??i l?i s?ng, dng thu?c, ho?c k?t h?p c? hai. Thay ??i l?i s?ng bao g?m gi?m cn, ?n ch? ?? ?n c l?i cho s?c kh?e, t natri, t?p th? d?c nhi?u h?n v h?n ch? u?ng r??u. Thng tin ny khng nh?m m?c ?ch thay th? cho l?i khuyn m chuyn gia ch?m [place] s?c kh?e ni v?i qu v?. Hy b?o ??m qu v? ph?i th?o lu?n b?t k? v?n ?? g m qu v? c v?i chuyn gia ch?m [place] s?c kh?e c?a qu v?. Document Released: 02/02/2005 Document Revised: 11/12/2017 Document Reviewed: 11/12/2017 Elsevier Patient Education  2020 [employer].

## 2018-09-16 ENCOUNTER — Ambulatory Visit: Payer: Medicaid Other | Admitting: Emergency Medicine

## 2018-12-14 ENCOUNTER — Other Ambulatory Visit: Payer: Self-pay | Admitting: Emergency Medicine

## 2018-12-14 DIAGNOSIS — I10 Essential (primary) hypertension: Secondary | ICD-10-CM

## 2018-12-14 NOTE — Telephone Encounter (Signed)
Patient's daughter calling and states that the patient is completely out of this medication. Advised of medication refill policy. Please advise

## 2019-03-11 ENCOUNTER — Other Ambulatory Visit: Payer: Self-pay | Admitting: Emergency Medicine

## 2019-03-11 DIAGNOSIS — I10 Essential (primary) hypertension: Secondary | ICD-10-CM

## 2019-03-11 NOTE — Telephone Encounter (Signed)
Forwarding medication refill request to the clinical pool for review. 

## 2019-03-15 ENCOUNTER — Other Ambulatory Visit: Payer: Self-pay

## 2019-03-15 ENCOUNTER — Ambulatory Visit (INDEPENDENT_AMBULATORY_CARE_PROVIDER_SITE_OTHER): Payer: Managed Care, Other (non HMO) | Admitting: Emergency Medicine

## 2019-03-15 ENCOUNTER — Encounter: Payer: Self-pay | Admitting: Emergency Medicine

## 2019-03-15 VITALS — BP 104/68 | HR 61 | Temp 98.4°F | Wt 144.8 lb

## 2019-03-15 DIAGNOSIS — Z8616 Personal history of COVID-19: Secondary | ICD-10-CM

## 2019-03-15 DIAGNOSIS — I10 Essential (primary) hypertension: Secondary | ICD-10-CM | POA: Diagnosis not present

## 2019-03-15 DIAGNOSIS — U071 COVID-19: Secondary | ICD-10-CM

## 2019-03-15 NOTE — Patient Instructions (Addendum)
If you have lab work done today you will be contacted with your lab results within the next 2 weeks.  If you have not heard from us then please contact us. The fastest way to get your results is to register for My Chart.   IF you received an x-ray today, you will receive an invoice from [facility]. Please contact [facility] at [phone] with questions or concerns regarding your invoice.   IF you received labwork today, you will receive an invoice from [facility]. Please contact [facility] at [phone] with questions or concerns regarding your invoice.   Our billing staff will not be able to assist you with questions regarding bills from these companies.  You will be contacted with the lab results as soon as they are available. The fastest way to get your results is to activate your My Chart account. Instructions are located on the last page of this paperwork. If you have not heard from us regarding the results in 2 weeks, please contact this office.      T?ng huy?t p, Ng??i l?n Hypertension, Adult Huy?t p cao (t?ng huy?t p) l khi l?c b?m mu qua ??ng m?ch qu m?nh. ??ng m?ch l cc m?ch mu mang mu t? tim ?i kh?p c? th?. T?ng huy?t p khi?n tim lm vi?c v?t v? h?n ?? b?m mu v c th? khi?n cc ??ng m?ch tr? ln h?p ho?c c?ng. T?ng huy?t p khng ???c ?i?u tr? ho?c ki?m sot c th? d?n t?i nh?i mu c? tim, suy tim, ??t qu?, b?nh th?n v nh?ng v?n ?? khc. Ch? s? ?o huy?t p g?m m?t ch? s? cao trn m?t ch? s? th?p. L t??ng l huy?t a?p cu?a quy? vi? c?n ph?i d??i 120/80. Ch? s? ??u tin ("??nh") ???c g?i l huy?t p tm thu. ?y l s? ?o p su?t trong ??ng m?ch khi tim qu v? ??p. Ch? s? th? hai ("?y") ???c g?i l huy?t p tm tr??ng. ?y l s? ?o p su?t trong ??ng m?ch khi tim qu v? ngh?[employer]. Nguyn nhn g gy ra? Khng r nguyn nhn chnh xc gy ra tnh tr?ng ny. C m?t s? tnh tr?ng d?n ??n ho?c c lin quan ??n huy?t p cao. ?i?u g lm t?ng nguy  c?? M?t s? y?u t? nguy c? d?n ??n huy?t p cao c th? ki?m sot ???c. Nh?ng y?u t? sau c th? lm qu v? d? b? tnh tr?ng ny h?n:  Ht thu?c.  B? b?nh ti?u ???ng tup 2, cholesterol cao, ho?c c? hai.  Khng t?p th? d?c ho?c cc ho?t ??ng th? ch?t ??y ??Marland Kitchen.  Th?a cn.  ?n qu nhi?u ch?t bo, ???ng, ca-lo, ho?c mu?i ([place]).  U?ng qu nhi?u r??u. M?t s? y?u t? nguy c? b? huy?t p cao c th? kh ho?c khng th? thay ??i. M?t s? y?u t? trong s? ny bao g?m:  B?nh th?n m?n tnh.  C ti?n s? gia ?nh b? cao huy?t p.  ?? tu?i. Nguy c? t?ng ln theo ?? tu?i.  Ch?ng t?c. Qu v? c nguy c? cao h?n n?u qu v? l ng??i M? g?c Phi.  Gi?i tnh. Nam gi?i c nguy c? cao h?n ph? n? tr??c tu?i 46. Sau tu?i 66, ph? n? c nguy c? cao h?n nam gi?i.  Ng?ng th? khi ng? do t?c ngh?n.  C?ng th?ng. Cc d?u hi?u ho?c tri?u ch?ng g? Huy?t p cao c th? khng gy ra cc tri?u ch?ng. Huy?t p r?t cao (c?n  t?ng huy?t p) c th? gy ra:  ?au ??u.  Lo u.  Kh th?.  Ch?y mu cam.  Bu?n nn v nn m?a.  Th? l?c thay ??i.  ?au ng?c d? d?i.  Co gi?t. Ch?n ?on tnh tr?ng ny nh? th? no? Tnh tr?ng ny ???c ch?n ?on b?ng cch ?o huy?t p c?a qu v? lc qu v? ng?i, ?? tay trn m?t b? m?t ph?ng, hai chn khng b?t cho, v hai bn chn b?ng ph?ng trn sn. B?ng qu?n thi?t b? ?o huy?t p s? ???c qu?n tr?c ti?p vo vng da cnh tay pha trn c?a qu v? ngang v?i m?c tim. Huy?t p c?n ???c ?o t nh?t hai l?n trn cng m?t cnh tay. M?t s? tnh tr?ng nh?t ??nh c th? lm cho huy?t p khc nhau gi?a tay ph?i v tay tri c?a qu v?. M?t s? y?u t? nh?t ??nh c th? khi?n ch? s? ?o huy?t p th?p h?n ho?c cao h?n so v?i bnh th??ng trong th?i gian ng?n:  Khi huy?t p c?a qu v? ? phng khm c?a chuyn gia ch?m [place] s?c kh?e cao h?n so v?i lc qu v? ? nh, hi?n t??ng ny ???c g?i l t?ng huy?t p o chong tr?ng. H?u h?t nh?ng ng??i b? tnh tr?ng ny ??u khng c?n dng thu?c.  Khi huy?t p c?a qu v? lc ? nh  cao h?n so v?i lc qu v? ? phng khm chuyn gia ch?m [place] s?c kh?e, hi?n t??ng ny ???c g?i l t?ng huy?t p ?n. H?u h?t nh?ng ng??i b? tnh tr?ng ny ??u c th? c?n dng thu?c ?? ki?m sot huy?t p. N?u qu v? c ch? s? huy?t p cao trong m?t l?n khm ho?c qu v? c huy?t p bnh th??ng c km cc y?u t? nguy c? khc, qu v? c th? ???c yu c?u:  Tr? l?i vo m?t ngy khc ?? ki?m tra l?i huy?t p.  Theo di huy?t p t?i nh trong vng 1 tu?n ho?c lu h?n. N?u qu v? ???c ch?n ?on b? t?ng huy?t p, qu v? c th? c?n th?c hi?n cc xt nghi?m mu ho?c ki?m tra hnh ?nh khc ?? gip chuyn gia ch?m [place] s?c kh?e hi?u nguy c? t?ng th? m?c cc b?nh tr?ng khc. Tnh tr?ng ny ???c ?i?u tr? nh? th? no? Tnh tr?ng ny ???c ?i?u tr? b?ng cch thay ??i l?i s?ng lnh m?nh, ch?ng h?nh nh? ?n th?c ph?m c l?i cho s?c kh?e, t?p th? d?c nhi?u h?n v gi?m l??ng r??u u?ng vo. N?u thay ??i l?i s?ng khng ?? ?? ??a huy?t p v? m?c c th? ki?m sot ???c, chuyn gia ch?m [place] s?c kh?e c th? k ??n thu?c, v n?u:  Huy?t p tm thu c?a qu v? trn 130.  Huy?t p tm tr??ng c?a qu v? trn 80. Huy?t p m?c tiu c nhn c?a qu v? c th? khc nhau ty thu?c v tnh tr?ng b?nh l, tu?i v cc nhn t? khc. Tun th? nh?ng h??ng d?n ny ? nh: ?n v u?ng   ?n ch? ?? giu ch?t x? v kali v t natri, ???ng ph? gia v ch?t bo. M?t k? ho?ch ?n m?u c tn l ch? ?? ?n DASH (Cch ti?p c?n ch? ?? ?n u?ng ?? ng?n ch?n t?ng huy?t p). ?n theo cch ny: ? ?n nhi?u tri cy v rau t??i. Vo m?i b?a ?n, c? g?ng dnh m?t n?a ??a cho tri cy v rau c?. ? ?n ng? c?c nguyn  cm, ch?ng h?n nh? m ?ng lm t? b?t m nguyn cm, g?o l?t, ho?c bnh m t? b?t m nguyn cm. Cho ngu? c?c nguyn ca?m va?o kho?ng m?t ph?n t? ??a. ? ?n ho?c hu?ng cc s?n ph?m t? s?a t bo, ch?ng h?n nh? s?a ? b? kem ho?c s?a chua t bo. ? Trnh nh?ng mi?ng th?t nhi?u m?, th?t ? qua ch? bi?n ho?c th?t ??p mu?i v th?t gia c?m c da. Dnh kho?ng m?t ph?n  t? ??a c?a qu v? cho cc protein khng m?, ch?ng h?n nh? c, th?t g khng da, ??u, tr?ng, ho?c ??u ph?. ? Trnh nh?ng th?c ph?m ch? bi?n ho?c lm s?n. Nh?ng th?c ph?m ny th??ng c nhi?u natri, ???ng ph? gia v ch?t bo h?n.  Gi?m l??ng dng natri hng ngy c?a qu v?. H?u h?t nh?ng ng??i b? t?ng huy?t p ??u nn ?n d??i 1.500 mg natri m?i ngy.  Khng u?ng r??u n?u: ? Chuyn gia ch?m [place] s?c kh?e c?a qu v? khuyn qu v? khng u?ng r??u. ? Qu v? c [place], c th? c [place], ho?c c k? ho?ch c [place].  N?u qu v? u?ng r??u: ? Gi?i h?n l??ng r??u qu v? u?ng ? m?c:  0-1 ly/ngy ??i v?i n? gi?i.  0-2 ly/ngy ??i v?i nam gi?i. ? Bi?t r m?t ly c bao nhiu r??u. ? M?, m?t ly t??ng ???ng v?i m?t chai bia 12 ao x? (355 mL), m?t ly r??u vang 5 ao x? (148 mL), ho?c m?t ly r??u m?nh 1 ao x? (44 mL). L?i s?ng   H?p tc v?i chuyn gia ch?m [place] s?c kh?e c?a qu v? ?? duy tr tr?ng l??ng c? th? c l?i cho s?c kh?e ho?c ?? gi?m cn. Hy h?i xem tr?ng l??ng no l l t??ng cho qu v?.  T?p th? d?c t nh?t 30 pht h?u h?t cc ngy trong tu?n. Cc ho?t ??ng c th? bao g?m ?i b?, b?i, ho?c ??p xe.  Bao g?m bi t?p t?ng c??ng c? (bi t?p khng l?c), ch?ng h?n nh? bi t?p [place] ho?c nng t?, nh? m?t ph?n c?a thi quen luy?n t?p hng tu?n c?a qu v?. C? g?ng t?p nh?ng lo?i bi t?p ny trong vng 30 pht t?i thi?u 3 ngy m?i tu?n.  Khng s? d?ng b?t k? s?n ph?m no c nicotine ho?c thu?c l, ch?ng ha?n nh? thu?c l d?ng ht, thu?c l ?i?n t? v thu?c l d?ng nhai. N?u qu v? c?n gip ?? ?? cai thu?c, hy h?i chuyn gia ch?m [place] s?c kh?e.  Theo di huy?t p c?a qu v? t?i nh theo h??ng d?n c?a chuyn gia ch?m [place] s?c kh?e.  Tun th? t?t c? cc l?n khm theo di theo ch? d?n c?a chuyn gia ch?m [place] s?c kh?e. ?i?u ny c vai tr quan tr?ng. Thu?c  Ch? s? d?ng thu?c khng k ??n v thu?c k ??n theo ch? d?n c?a chuyn gia ch?m [place] s?c kh?e. Lm theo ch? d?n m?t cch c?n th?n. Thu?c ?i?u tr? huy?t p ph?i  ???c dng theo ??n ? k.  Khng b? cc li?u thu?c huy?t p. Vi?c b? dng thu?c khi?n qu v? c nguy c? g?p ph?i cc v?n ?? v c th? lm cho thu?c gi?m hi?u qu?Marland Kitchen  Hy h?i chuyn gia ch?m [place] s?c kh?e c?a qu v? v? nh?ng tc d?ng ph? ho?c ph?n ?ng v?i thu?c m qu v? ph?i theo di. Hy lin l?c v?i chuyn gia ch?m [place] s?c kh?e n?u qu  v?:  Ngh? qu v? c ph?n ?ng v?i thu?c ?ang dng.  B? ?au ??u ti?p t?c tr? l?i (ti pht).  C?m th?y chng m?t.  B? s?ng ph ? c? chn.  C v?n ?? v? th? l?c. Yu c?u tr? gip ngay l?p t?c n?u qu v?:  B? ?au ??u r?t nhi?u ho?c l l?n.  B? y?u ho?c t b b?t th??ng.  C?m th?y ng?t x?u.  B? ?au r?t nhi?u ? ng?c ho?c b?ng.  Nn nhi?u l?n.  B? kh th?. Tm t?t  T?ng huy?t p l khi l?c b?m mu qua cc ??ng m?ch c?a qu v? qu m?nh. N?u tnh tr?ng ny khng ???c ki?m sot, n c th? khi?n qu v? c nguy c? v? cc bi?n ch?ng nghim tr?ng.  Huy?t p m?c tiu c nhn c?a qu v? c th? khc nhau ty thu?c v tnh tr?ng b?nh l, tu?i v cc nhn t? khc. ??i v?i h?u h?t m?i ng??i, huy?t p bnh th??ng l d??i 120/80.  ?i?u tr? t?ng huy?t p b?ng cch thay ??i l?i s?ng, dng thu?c, ho?c k?t h?p c? hai. Thay ??i l?i s?ng bao g?m gi?m cn, ?n ch? ?? ?n c l?i cho s?c kh?e, t natri, t?p th? d?c nhi?u h?n v h?n ch? u?ng r??u. Thng tin ny khng nh?m m?c ?ch thay th? cho l?i khuyn m chuyn gia ch?m [place] s?c kh?e ni v?i qu v?. Hy b?o ??m qu v? ph?i th?o lu?n b?t k? v?n ?? g m qu v? c v?i chuyn gia ch?m [place] s?c kh?e c?a qu v?. Document Revised: 11/12/2017 Document Reviewed: 11/12/2017 Elsevier Patient Education  2020 [employer] Inc.  Health Maintenance, Female Adopting a healthy lifestyle and getting preventive care are important in promoting health and wellness. Ask your health care provider about:  The right schedule for you to have regular tests and exams.  Things you can do on your own to prevent diseases and keep yourself healthy. What  should I know about diet, weight, and exercise? Eat a healthy diet   Eat a diet that includes plenty of vegetables, fruits, low-fat dairy products, and lean protein.  Do not eat a lot of foods that are high in solid fats, added sugars, or sodium. Maintain a healthy weight Body mass index (BMI) is used to identify weight problems. It estimates body fat based on height and weight. Your health care provider can help determine your BMI and help you achieve or maintain a healthy weight. Get regular exercise Get regular exercise. This is one of the most important things you can do for your health. Most adults should:  Exercise for at least 150 minutes each week. The exercise should increase your heart rate and make you sweat (moderate-intensity exercise).  Do strengthening exercises at least twice a week. This is in addition to the moderate-intensity exercise.  Spend less time sitting. Even light physical activity can be beneficial. Watch cholesterol and blood lipids Have your blood tested for lipids and cholesterol at 48 years of age, then have this test every 5 years. Have your cholesterol levels checked more often if:  Your lipid or cholesterol levels are high.  You are older than 48 years of age.  You are at high risk for heart disease. What should I know about cancer screening? Depending on your health history and family history, you may need to have cancer screening at various ages. This may include screening for:  Breast cancer.  Cervical cancer.  Colorectal cancer.  Skin  cancer.  Lung cancer. What should I know about heart disease, diabetes, and high blood pressure? Blood pressure and heart disease  High blood pressure causes heart disease and increases the risk of stroke. This is more likely to develop in people who have high blood pressure readings, are of African descent, or are overweight.  Have your blood pressure checked: ? Every 3-5 years if you are 18-39 years of  age. ? Every year if you are 10863 years old or older. Diabetes Have regular diabetes screenings. This checks your fasting blood sugar level. Have the screening done:  Once every three years after age 48 if you are at a normal weight and have a low risk for diabetes.  More often and at a younger age if you are overweight or have a high risk for diabetes. What should I know about preventing infection? Hepatitis B If you have a higher risk for hepatitis B, you should be screened for this virus. Talk with your health care provider to find out if you are at risk for hepatitis B infection. Hepatitis C Testing is recommended for:  Everyone born from [birth year] through 1964.  Anyone with known risk factors for hepatitis C. Sexually transmitted infections (STIs)  Get screened for STIs, including gonorrhea and chlamydia, if: ? You are sexually active and are younger than 10624 years of age. ? You are older than 48 years of age and your health care provider tells you that you are at risk for this type of infection. ? Your sexual activity has changed since you were last screened, and you are at increased risk for chlamydia or gonorrhea. Ask your health care provider if you are at risk.  Ask your health care provider about whether you are at high risk for HIV. Your health care provider may recommend a prescription medicine to help prevent HIV infection. If you choose to take medicine to prevent HIV, you should first get tested for HIV. You should then be tested every 3 months for as long as you are taking the medicine. Pregnancy  If you are about to stop having your period (premenopausal) and you may become pregnant, seek counseling before you get pregnant.  Take 400 to 800 micrograms (mcg) of folic acid every day if you become pregnant.  Ask for birth control (contraception) if you want to prevent pregnancy. Osteoporosis and menopause Osteoporosis is a disease in which the bones lose minerals and strength  with aging. This can result in bone fractures. If you are 532 years old or older, or if you are at risk for osteoporosis and fractures, ask your health care provider if you should:  Be screened for bone loss.  Take a calcium or vitamin D supplement to lower your risk of fractures.  Be given hormone replacement therapy (HRT) to treat symptoms of menopause. Follow these instructions at home: Lifestyle  Do not use any products that contain nicotine or tobacco, such as cigarettes, e-cigarettes, and chewing tobacco. If you need help quitting, ask your health care provider.  Do not use street drugs.  Do not share needles.  Ask your health care provider for help if you need support or information about quitting drugs. Alcohol use  Do not drink alcohol if: ? Your health care provider tells you not to drink. ? You are pregnant, may be pregnant, or are planning to become pregnant.  If you drink alcohol: ? Limit how much you use to 0-1 drink a day. ? Limit intake if you are  breastfeeding.  Be aware of how much alcohol is in your drink. In the [place] one drink equals one 12 oz bottle of beer (355 mL), one 5 oz glass of wine (148 mL), or one 1 oz glass of hard liquor (44 mL). General instructions  Schedule regular health, dental, and eye exams.  Stay current with your vaccines.  Tell your health care provider if: ? You often feel depressed. ? You have ever been abused or do not feel safe at home. Summary  Adopting a healthy lifestyle and getting preventive care are important in promoting health and wellness.  Follow your health care provider's instructions about healthy diet, exercising, and getting tested or screened for diseases.  Follow your health care provider's instructions on monitoring your cholesterol and blood pressure. This information is not intended to replace advice given to you by your health care provider. Make sure you discuss any questions you have with your health  care provider. Document Revised: 01/26/2018 Document Reviewed: 01/26/2018 Elsevier Patient Education  2020 [employer].  Hypertension, Adult High blood pressure (hypertension) is when the force of blood pumping through the arteries is too strong. The arteries are the blood vessels that carry blood from the heart throughout the body. Hypertension forces the heart to work harder to pump blood and may cause arteries to become narrow or stiff. Untreated or uncontrolled hypertension can cause a heart attack, heart failure, a stroke, kidney disease, and other problems. A blood pressure reading consists of a higher number over a lower number. Ideally, your blood pressure should be below 120/80. The first ("top") number is called the systolic pressure. It is a measure of the pressure in your arteries as your heart beats. The second ("bottom") number is called the diastolic pressure. It is a measure of the pressure in your arteries as the heart relaxes. What are the causes? The exact cause of this condition is not known. There are some conditions that result in or are related to high blood pressure. What increases the risk? Some risk factors for high blood pressure are under your control. The following factors may make you more likely to develop this condition:  Smoking.  Having type 2 diabetes mellitus, high cholesterol, or both.  Not getting enough exercise or physical activity.  Being overweight.  Having too much fat, sugar, calories, or salt (sodium) in your diet.  Drinking too much alcohol. Some risk factors for high blood pressure may be difficult or impossible to change. Some of these factors include:  Having chronic kidney disease.  Having a family history of high blood pressure.  Age. Risk increases with age.  Race. You may be at higher risk if you are African American.  Gender. Men are at higher risk than women before age 60. After age 21, women are at higher risk than  men.  Having obstructive sleep apnea.  Stress. What are the signs or symptoms? High blood pressure may not cause symptoms. Very high blood pressure (hypertensive crisis) may cause:  Headache.  Anxiety.  Shortness of breath.  Nosebleed.  Nausea and vomiting.  Vision changes.  Severe chest pain.  Seizures. How is this diagnosed? This condition is diagnosed by measuring your blood pressure while you are seated, with your arm resting on a flat surface, your legs uncrossed, and your feet flat on the floor. The cuff of the blood pressure monitor will be placed directly against the skin of your upper arm at the level of your heart. It should be measured  at least twice using the same arm. Certain conditions can cause a difference in blood pressure between your right and left arms. Certain factors can cause blood pressure readings to be lower or higher than normal for a short period of time:  When your blood pressure is higher when you are in a health care provider's office than when you are at home, this is called white coat hypertension. Most people with this condition do not need medicines.  When your blood pressure is higher at home than when you are in a health care provider's office, this is called masked hypertension. Most people with this condition may need medicines to control blood pressure. If you have a high blood pressure reading during one visit or you have normal blood pressure with other risk factors, you may be asked to:  Return on a different day to have your blood pressure checked again.  Monitor your blood pressure at home for 1 week or longer. If you are diagnosed with hypertension, you may have other blood or imaging tests to help your health care provider understand your overall risk for other conditions. How is this treated? This condition is treated by making healthy lifestyle changes, such as eating healthy foods, exercising more, and reducing your alcohol  intake. Your health care provider may prescribe medicine if lifestyle changes are not enough to get your blood pressure under control, and if:  Your systolic blood pressure is above 130.  Your diastolic blood pressure is above 80. Your personal target blood pressure may vary depending on your medical conditions, your age, and other factors. Follow these instructions at home: Eating and drinking   Eat a diet that is high in fiber and potassium, and low in sodium, added sugar, and fat. An example eating plan is called the DASH (Dietary Approaches to Stop Hypertension) diet. To eat this way: ? Eat plenty of fresh fruits and vegetables. Try to fill one half of your plate at each meal with fruits and vegetables. ? Eat whole grains, such as whole-wheat pasta, brown rice, or whole-grain bread. Fill about one fourth of your plate with whole grains. ? Eat or drink low-fat dairy products, such as skim milk or low-fat yogurt. ? Avoid fatty cuts of meat, processed or cured meats, and poultry with skin. Fill about one fourth of your plate with lean proteins, such as fish, chicken without skin, beans, eggs, or tofu. ? Avoid pre-made and processed foods. These tend to be higher in sodium, added sugar, and fat.  Reduce your daily sodium intake. Most people with hypertension should eat less than 1,500 mg of sodium a day.  Do not drink alcohol if: ? Your health care provider tells you not to drink. ? You are pregnant, may be pregnant, or are planning to become pregnant.  If you drink alcohol: ? Limit how much you use to:  0-1 drink a day for women.  0-2 drinks a day for men. ? Be aware of how much alcohol is in your drink. In the [place] one drink equals one 12 oz bottle of beer (355 mL), one 5 oz glass of wine (148 mL), or one 1 oz glass of hard liquor (44 mL). Lifestyle   Work with your health care provider to maintain a healthy body weight or to lose weight. Ask what an ideal weight is for  you.  Get at least 30 minutes of exercise most days of the week. Activities may include walking, swimming, or biking.  Include exercise  to strengthen your muscles (resistance exercise), such as Pilates or lifting weights, as part of your weekly exercise routine. Try to do these types of exercises for 30 minutes at least 3 days a week.  Do not use any products that contain nicotine or tobacco, such as cigarettes, e-cigarettes, and chewing tobacco. If you need help quitting, ask your health care provider.  Monitor your blood pressure at home as told by your health care provider.  Keep all follow-up visits as told by your health care provider. This is important. Medicines  Take over-the-counter and prescription medicines only as told by your health care provider. Follow directions carefully. Blood pressure medicines must be taken as prescribed.  Do not skip doses of blood pressure medicine. Doing this puts you at risk for problems and can make the medicine less effective.  Ask your health care provider about side effects or reactions to medicines that you should watch for. Contact a health care provider if you:  Think you are having a reaction to a medicine you are taking.  Have headaches that keep coming back (recurring).  Feel dizzy.  Have swelling in your ankles.  Have trouble with your vision. Get help right away if you:  Develop a severe headache or confusion.  Have unusual weakness or numbness.  Feel faint.  Have severe pain in your chest or abdomen.  Vomit repeatedly.  Have trouble breathing. Summary  Hypertension is when the force of blood pumping through your arteries is too strong. If this condition is not controlled, it may put you at risk for serious complications.  Your personal target blood pressure may vary depending on your medical conditions, your age, and other factors. For most people, a normal blood pressure is less than 120/80.  Hypertension is  treated with lifestyle changes, medicines, or a combination of both. Lifestyle changes include losing weight, eating a healthy, low-sodium diet, exercising more, and limiting alcohol. This information is not intended to replace advice given to you by your health care provider. Make sure you discuss any questions you have with your health care provider. Document Revised: 10/13/2017 Document Reviewed: 10/13/2017 Elsevier Patient Education  2020 [employer].

## 2019-03-15 NOTE — Progress Notes (Signed)
BP Readings from Last 3 Encounters:  03/15/19 104/68  09/14/18 131/77  03/15/18 (!) 159/90   [name] 49 y.o.   Chief Complaint  Patient presents with  . Medical Management of Chronic Issues    6 month f/u    HISTORY OF PRESENT ILLNESS: This is a 49 y.o. female here for follow-up of hypertension on Zestoretic 10-12.5 mg daily.  Doing well.  No complaints. Recent Covid infection 5 weeks ago.  Retesting was negative.  Asymptomatic today. Occasionally takes meloxicam 7.5 milligrams for bad headaches.  Advised not to take meloxicam frequently. No other complaints or medical concerns today.  HPI   Prior to Admission medications   Medication Sig Start Date End Date Taking? Authorizing Provider  lisinopril-hydrochlorothiazide (ZESTORETIC) 10-12.5 MG tablet TAKE 1 TABLET BY MOUTH EVERY DAY 03/14/19  Yes Ras Kollman, Ines Bloomer, MD    Allergies  Allergen Reactions  . Shellfish Allergy Anaphylaxis and Swelling    All over body  . Chicken (Grilled) Flavor [Flavoring Agent] Swelling    All over body    Patient Active Problem List   Diagnosis Date Noted  . Chronic tension-type headache, not intractable 03/15/2018  . Essential hypertension 03/26/2010    Past Medical History:  Diagnosis Date  . Hypertension     Past Surgical History:  Procedure Laterality Date  . CESAREAN SECTION    . TUBAL LIGATION      Social History   Socioeconomic History  . Marital status: Married    Spouse name: Not on file  . Number of children: Not on file  . Years of education: Not on file  . Highest education level: Not on file  Occupational History  . Not on file  Tobacco Use  . Smoking status: Never Smoker  . Smokeless tobacco: Never Used  Substance and Sexual Activity  . Alcohol use: No    Alcohol/week: 0.0 standard drinks  . Drug use: No  . Sexual activity: Not on file  Other Topics Concern  . Not on file  Social History Narrative  . Not on file   Social Determinants of Health    Financial Resource Strain:   . Difficulty of Paying Living Expenses: Not on file  Food Insecurity:   . Worried About Charity fundraiser in the Last Year: Not on file  . Ran Out of Food in the Last Year: Not on file  Transportation Needs:   . Lack of Transportation (Medical): Not on file  . Lack of Transportation (Non-Medical): Not on file  Physical Activity:   . Days of Exercise per Week: Not on file  . Minutes of Exercise per Session: Not on file  Stress:   . Feeling of Stress : Not on file  Social Connections:   . Frequency of Communication with Friends and Family: Not on file  . Frequency of Social Gatherings with Friends and Family: Not on file  . Attends Religious Services: Not on file  . Active Member of Clubs or Organizations: Not on file  . Attends Archivist Meetings: Not on file  . Marital Status: Not on file  Intimate Partner Violence:   . Fear of Current or Ex-Partner: Not on file  . Emotionally Abused: Not on file  . Physically Abused: Not on file  . Sexually Abused: Not on file    History reviewed. No pertinent family history.   Review of Systems  Constitutional: Negative.  Negative for chills and fever.  HENT: Negative.  Negative for congestion  and sore throat.   Respiratory: Negative.  Negative for cough and shortness of breath.   Cardiovascular: Negative.  Negative for chest pain and palpitations.  Gastrointestinal: Negative.  Negative for abdominal pain, nausea and vomiting.  Genitourinary: Negative.  Negative for dysuria and hematuria.  Musculoskeletal: Negative.  Negative for back pain and joint pain.  Skin: Negative.  Negative for rash.  Neurological: Positive for headaches (Occasional). Negative for sensory change and weakness.  All other systems reviewed and are negative.  Today's Vitals   03/15/19 1010  BP: 104/68  Pulse: 61  Temp: 98.4 F (36.9 C)  TempSrc: Temporal  SpO2: 98%  Weight: 144 lb 12.8 oz (65.7 kg)   Body mass  index is 30.26 kg/m.   Physical Exam Vitals reviewed.  HENT:     Head: Normocephalic.  Eyes:     Extraocular Movements: Extraocular movements intact.     Conjunctiva/sclera: Conjunctivae normal.     Pupils: Pupils are equal, round, and reactive to light.  Cardiovascular:     Rate and Rhythm: Normal rate and regular rhythm.     Pulses: Normal pulses.     Heart sounds: Normal heart sounds.  Pulmonary:     Effort: Pulmonary effort is normal.     Breath sounds: Normal breath sounds.  Musculoskeletal:        General: Normal range of motion.     Cervical back: Normal range of motion and neck supple.  Skin:    General: Skin is warm and dry.     Capillary Refill: Capillary refill takes less than 2 seconds.  Neurological:     General: No focal deficit present.     Mental Status: She is alert and oriented to person, place, and time.  Psychiatric:        Mood and Affect: Mood normal.        Behavior: Behavior normal.      ASSESSMENT & PLAN:  Essential hypertension Well-controlled.  Continue present medication.  No changes.  Follow-up in 6 months.   [name] was seen today for medical management of chronic issues.  Diagnoses and all orders for this visit:  Essential hypertension -     Comprehensive metabolic panel -     Lipid panel  COVID-19 virus infection Comments: Recent and improved Orders: -     SAR CoV2 Serology (COVID 19)AB(IGG)IA    Patient Instructions       If you have lab work done today you will be contacted with your lab results within the next 2 weeks.  If you have not heard from us then please contact us. The fastest way to get your results is to register for My Chart.   IF you received an x-ray today, you will receive an invoice [facility]. Please contact [facility] at [phone] with questions or concerns regarding your invoice.   IF you received labwork today, you will receive an invoice from [facility]. Please contact  [facility] at [phone] with questions or concerns regarding your invoice.   Our billing staff will not be able to assist you with questions regarding bills from these companies.  You will be contacted with the lab results as soon as they are available. The fastest way to get your results is to activate your My Chart account. Instructions are located on the last page of this paperwork. If you have not heard from us regarding the results in 2 weeks, please contact this office.      T?ng huy?t p, Ng??i  l?n Hypertension, Adult Huy?t p cao (t?ng huy?t p) l khi l?c b?m mu qua ??ng m?ch qu m?nh. ??ng m?ch l cc m?ch mu mang mu t? tim ?i kh?p c? th?. T?ng huy?t p khi?n tim lm vi?c v?t v? h?n ?? b?m mu v c th? khi?n cc ??ng m?ch tr? ln h?p ho?c c?ng. T?ng huy?t p khng ???c ?i?u tr? ho?c ki?m sot c th? d?n t?i nh?i mu c? tim, suy tim, ??t qu?, b?nh th?n v nh?ng v?n ?? khc. Ch? s? ?o huy?t p g?m m?t ch? s? cao trn m?t ch? s? th?p. L t??ng l huy?t a?p cu?a quy? vi? c?n ph?i d??i 120/80. Ch? s? ??u tin ("??nh") ???c g?i l huy?t p tm thu. ?y l s? ?o p su?t trong ??ng m?ch khi tim qu v? ??p. Ch? s? th? hai ("?y") ???c g?i l huy?t p tm tr??ng. ?y l s? ?o p su?t trong ??ng m?ch khi tim qu v? ngh?[place] nhn g gy ra? Khng r nguyn nhn chnh xc gy ra tnh tr?ng ny. C m?t s? tnh tr?ng d?n ??n ho?c c lin quan ??n huy?t p cao. ?i?u g lm t?ng nguy c?? M?t s? y?u t? nguy c? d?n ??n huy?t p cao c th? ki?m sot ???c. Nh?ng y?u t? sau c th? lm qu v? d? b? tnh tr?ng ny h?n:  Ht thu?c.  B? b?nh ti?u ???ng tup 2, cholesterol cao, ho?c c? hai.  Khng t?p th? d?c ho?c cc ho?t ??ng th? ch?t ??y ??Marland Kitchen  Th?a cn.  ?n qu nhi?u ch?t bo, ???ng, ca-lo, ho?c mu?i ([place]).  U?ng qu nhi?u r??u. M?t s? y?u t? nguy c? b? huy?t p cao c th? kh ho?c khng th? thay ??i. M?t s? y?u t? trong s? ny bao g?m:  B?nh th?n m?n tnh.  C ti?n s? gia ?nh b? cao  huy?t p.  ?? tu?i. Nguy c? t?ng ln theo ?? tu?i.  Ch?ng t?c. Qu v? c nguy c? cao h?n n?u qu v? l ng??i M? g?c Phi.  Gi?i tnh. Nam gi?i c nguy c? cao h?n ph? n? tr??c tu?i 46. Sau tu?i 66, ph? n? c nguy c? cao h?n nam gi?i.  Ng?ng th? khi ng? do t?c ngh?n.  C?ng th?ng. Cc d?u hi?u ho?c tri?u ch?ng g? Huy?t p cao c th? khng gy ra cc tri?u ch?ng. Huy?t p r?t cao (c?n t?ng huy?t p) c th? gy ra:  ?au ??u.  Lo u.  Kh th?.  Ch?y mu cam.  Bu?n nn v nn m?a.  Th? l?c thay ??i.  ?au ng?c d? d?i.  Co gi?t. Ch?n ?on tnh tr?ng ny nh? th? no? Tnh tr?ng ny ???c ch?n ?on b?ng cch ?o huy?t p c?a qu v? lc qu v? ng?i, ?? tay trn m?t b? m?t ph?ng, hai chn khng b?t cho, v hai bn chn b?ng ph?ng trn sn. B?ng qu?n thi?t b? ?o huy?t p s? ???c qu?n tr?c ti?p vo vng da cnh tay pha trn c?a qu v? ngang v?i m?c tim. Huy?t p c?n ???c ?o t nh?t hai l?n trn cng m?t cnh tay. M?t s? tnh tr?ng nh?t ??nh c th? lm cho huy?t p khc nhau gi?a tay ph?i v tay tri c?a qu v?. M?t s? y?u t? nh?t ??nh c th? khi?n ch? s? ?o huy?t p th?p h?n ho?c cao h?n so v?i bnh th??ng trong th?i gian ng?n:  Khi huy?t p c?a qu v? ? phng khm c?a [place]  gia ch?m [place] s?c kh?e cao h?n so v?i lc qu v? ? nh, hi?n t??ng ny ???c g?i l t?ng huy?t p o chong tr?ng. H?u h?t nh?ng ng??i b? tnh tr?ng ny ??u khng c?n dng thu?c.  Khi huy?t p c?a qu v? lc ? nh cao h?n so v?i lc qu v? ? phng khm chuyn gia ch?m [place] s?c kh?e, hi?n t??ng ny ???c g?i l t?ng huy?t p ?n. H?u h?t nh?ng ng??i b? tnh tr?ng ny ??u c th? c?n dng thu?c ?? ki?m sot huy?t p. N?u qu v? c ch? s? huy?t p cao trong m?t l?n khm ho?c qu v? c huy?t p bnh th??ng c km cc y?u t? nguy c? khc, qu v? c th? ???c yu c?u:  Tr? l?i vo m?t ngy khc ?? ki?m tra l?i huy?t p.  Theo di huy?t p t?i nh trong vng 1 tu?n ho?c lu h?n. N?u qu v? ???c ch?n ?on b? t?ng huy?t p, qu v? c  th? c?n th?c hi?n cc xt nghi?m mu ho?c ki?m tra hnh ?nh khc ?? gip chuyn gia ch?m [place] s?c kh?e hi?u nguy c? t?ng th? m?c cc b?nh tr?ng khc. Tnh tr?ng ny ???c ?i?u tr? nh? th? no? Tnh tr?ng ny ???c ?i?u tr? b?ng cch thay ??i l?i s?ng lnh m?nh, ch?ng h?nh nh? ?n th?c ph?m c l?i cho s?c kh?e, t?p th? d?c nhi?u h?n v gi?m l??ng r??u u?ng vo. N?u thay ??i l?i s?ng khng ?? ?? ??a huy?t p v? m?c c th? ki?m sot ???c, chuyn gia ch?m [place] s?c kh?e c th? k ??n thu?c, v n?u:  Huy?t p tm thu c?a qu v? trn 130.  Huy?t p tm tr??ng c?a qu v? trn 80. Huy?t p m?c tiu c nhn c?a qu v? c th? khc nhau ty thu?c v tnh tr?ng b?nh l, tu?i v cc nhn t? khc. Tun th? nh?ng h??ng d?n ny ? nh: ?n v u?ng   ?n ch? ?? giu ch?t x? v kali v t natri, ???ng ph? gia v ch?t bo. M?t k? ho?ch ?n m?u c tn l ch? ?? ?n DASH (Cch ti?p c?n ch? ?? ?n u?ng ?? ng?n ch?n t?ng huy?t p). ?n theo cch ny: ? ?n nhi?u tri cy v rau t??i. Vo m?i b?a ?n, c? g?ng dnh m?t n?a ??a cho tri cy v rau c?. ? ?n ng? c?c nguyn cm, ch?ng h?n nh? m ?ng lm t? b?t m nguyn cm, g?o l?t, ho?c bnh m t? b?t m nguyn cm. Cho ngu? c?c nguyn ca?m va?o kho?ng m?t ph?n t? ??a. ? ?n ho?c hu?ng cc s?n ph?m t? s?a t bo, ch?ng h?n nh? s?a ? b? kem ho?c s?a chua t bo. ? Trnh nh?ng mi?ng th?t nhi?u m?, th?t ? qua ch? bi?n ho?c th?t ??p mu?i v th?t gia c?m c da. Dnh kho?ng m?t ph?n t? ??a c?a qu v? cho cc protein khng m?, ch?ng h?n nh? c, th?t g khng da, ??u, tr?ng, ho?c ??u ph?. ? Trnh nh?ng th?c ph?m ch? bi?n ho?c lm s?n. Nh?ng th?c ph?m ny th??ng c nhi?u natri, ???ng ph? gia v ch?t bo h?n.  Gi?m l??ng dng natri hng ngy c?a qu v?. H?u h?t nh?ng ng??i b? t?ng huy?t p ??u nn ?n d??i 1.500 mg natri m?i ngy.  Khng u?ng r??u n?u: ? Chuyn gia ch?m [place] s?c kh?e c?a qu v? khuyn qu v? khng u?ng r??u. ? Qu v? c [place], c th? c [place],  ho?c c k? ho?ch c [place].  N?u qu  v? u?ng r??u: ? Gi?i h?n l??ng r??u qu v? u?ng ? m?c:  0-1 ly/ngy ??i v?i n? gi?i.  0-2 ly/ngy ??i v?i nam gi?i. ? Bi?t r m?t ly c bao nhiu r??u. ? M?, m?t ly t??ng ???ng v?i m?t chai bia 12 ao x? (355 mL), m?t ly r??u vang 5 ao x? (148 mL), ho?c m?t ly r??u m?nh 1 ao x? (44 mL). L?i s?ng   H?p tc v?i chuyn gia ch?m [place] s?c kh?e c?a qu v? ?? duy tr tr?ng l??ng c? th? c l?i cho s?c kh?e ho?c ?? gi?m cn. Hy h?i xem tr?ng l??ng no l l t??ng cho qu v?.  T?p th? d?c t nh?t 30 pht h?u h?t cc ngy trong tu?n. Cc ho?t ??ng c th? bao g?m ?i b?, b?i, ho?c ??p xe.  Bao g?m bi t?p t?ng c??ng c? (bi t?p khng l?c), ch?ng h?n nh? bi t?p [place] ho?c nng t?, nh? m?t ph?n c?a thi quen luy?n t?p hng tu?n c?a qu v?. C? g?ng t?p nh?ng lo?i bi t?p ny trong vng 30 pht t?i thi?u 3 ngy m?i tu?n.  Khng s? d?ng b?t k? s?n ph?m no c nicotine ho?c thu?c l, ch?ng ha?n nh? thu?c l d?ng ht, thu?c l ?i?n t? v thu?c l d?ng nhai. N?u qu v? c?n gip ?? ?? cai thu?c, hy h?i chuyn gia ch?m [place] s?c kh?e.  Theo di huy?t p c?a qu v? t?i nh theo h??ng d?n c?a chuyn gia ch?m [place] s?c kh?e.  Tun th? t?t c? cc l?n khm theo di theo ch? d?n c?a chuyn gia ch?m [place] s?c kh?e. ?i?u ny c vai tr quan tr?ng. Thu?c  Ch? s? d?ng thu?c khng k ??n v thu?c k ??n theo ch? d?n c?a chuyn gia ch?m [place] s?c kh?e. Lm theo ch? d?n m?t cch c?n th?n. Thu?c ?i?u tr? huy?t p ph?i ???c dng theo ??n ? k.  Khng b? cc li?u thu?c huy?t p. Vi?c b? dng thu?c khi?n qu v? c nguy c? g?p ph?i cc v?n ?? v c th? lm cho thu?c gi?m hi?u qu?Marland Kitchen.  Hy h?i chuyn gia ch?m [place] s?c kh?e c?a qu v? v? nh?ng tc d?ng ph? ho?c ph?n ?ng v?i thu?c m qu v? ph?i theo di. Hy lin l?c v?i chuyn gia ch?m [place] s?c kh?e n?u qu v?:  Ngh? qu v? c ph?n ?ng v?i thu?c ?ang dng.  B? ?au ??u ti?p t?c tr? l?i (ti pht).  C?m th?y chng m?t.  B? s?ng ph ? c? chn.  C v?n ?? v? th? l?c. Yu c?u tr?  gip ngay l?p t?c n?u qu v?:  B? ?au ??u r?t nhi?u ho?c l l?n.  B? y?u ho?c t b b?t th??ng.  C?m th?y ng?t x?u.  B? ?au r?t nhi?u ? ng?c ho?c b?ng.  Nn nhi?u l?n.  B? kh th?. Tm t?t  T?ng huy?t p l khi l?c b?m mu qua cc ??ng m?ch c?a qu v? qu m?nh. N?u tnh tr?ng ny khng ???c ki?m sot, n c th? khi?n qu v? c nguy c? v? cc bi?n ch?ng nghim tr?ng.  Huy?t p m?c tiu c nhn c?a qu v? c th? khc nhau ty thu?c v tnh tr?ng b?nh l, tu?i v cc nhn t? khc. ??i v?i h?u h?t m?i ng??i, huy?t p bnh th??ng l d??i 120/80.  ?i?u tr? t?ng huy?t p b?ng cch thay ??i l?i s?ng, dng thu?c,  ho?c k?t h?p c? hai. Thay ??i l?i s?ng bao g?m gi?m cn, ?n ch? ?? ?n c l?i cho s?c kh?e, t natri, t?p th? d?c nhi?u h?n v h?n ch? u?ng r??u. Thng tin ny khng nh?m m?c ?ch thay th? cho l?i khuyn m chuyn gia ch?m [place] s?c kh?e ni v?i qu v?. Hy b?o ??m qu v? ph?i th?o lu?n b?t k? v?n ?? g m qu v? c v?i chuyn gia ch?m [place] s?c kh?e c?a qu v?. Document Revised: 11/12/2017 Document Reviewed: 11/12/2017 Elsevier Patient Education  2020 Elsevier Inc.  Health Maintenance, Female Adopting a healthy lifestyle and getting preventive care are important in promoting health and wellness. Ask your health care provider about:  The right schedule for you to have regular tests and exams.  Things you can do on your own to prevent diseases and keep yourself healthy. What should I know about diet, weight, and exercise? Eat a healthy diet   Eat a diet that includes plenty of vegetables, fruits, low-fat dairy products, and lean protein.  Do not eat a lot of foods that are high in solid fats, added sugars, or sodium. Maintain a healthy weight Body mass index (BMI) is used to identify weight problems. It estimates body fat based on height and weight. Your health care provider can help determine your BMI and help you achieve or maintain a healthy weight. Get regular exercise Get  regular exercise. This is one of the most important things you can do for your health. Most adults should:  Exercise for at least 150 minutes each week. The exercise should increase your heart rate and make you sweat (moderate-intensity exercise).  Do strengthening exercises at least twice a week. This is in addition to the moderate-intensity exercise.  Spend less time sitting. Even light physical activity can be beneficial. Watch cholesterol and blood lipids Have your blood tested for lipids and cholesterol at 48 years of age, then have this test every 5 years. Have your cholesterol levels checked more often if:  Your lipid or cholesterol levels are high.  You are older than 48 years of age.  You are at high risk for heart disease. What should I know about cancer screening? Depending on your health history and family history, you may need to have cancer screening at various ages. This may include screening for:  Breast cancer.  Cervical cancer.  Colorectal cancer.  Skin cancer.  Lung cancer. What should I know about heart disease, diabetes, and high blood pressure? Blood pressure and heart disease  High blood pressure causes heart disease and increases the risk of stroke. This is more likely to develop in people who have high blood pressure readings, are of African descent, or are overweight.  Have your blood pressure checked: ? Every 3-5 years if you are 72-50 years of age. ? Every year if you are 77 years old or older. Diabetes Have regular diabetes screenings. This checks your fasting blood sugar level. Have the screening done:  Once every three years after age 74 if you are at a normal weight and have a low risk for diabetes.  More often and at a younger age if you are overweight or have a high risk for diabetes. What should I know about preventing infection? Hepatitis B If you have a higher risk for hepatitis B, you should be screened for this virus. Talk with your  health care provider to find out if you are at risk for hepatitis B infection. Hepatitis C Testing  is recommended for:  Everyone born from [birth year] through 1964.  Anyone with known risk factors for hepatitis C. Sexually transmitted infections (STIs)  Get screened for STIs, including gonorrhea and chlamydia, if: ? You are sexually active and are younger than 49 years of age. ? You are older than 48 years of age and your health care provider tells you that you are at risk for this type of infection. ? Your sexual activity has changed since you were last screened, and you are at increased risk for chlamydia or gonorrhea. Ask your health care provider if you are at risk.  Ask your health care provider about whether you are at high risk for HIV. Your health care provider may recommend a prescription medicine to help prevent HIV infection. If you choose to take medicine to prevent HIV, you should first get tested for HIV. You should then be tested every 3 months for as long as you are taking the medicine. Pregnancy  If you are about to stop having your period (premenopausal) and you may become pregnant, seek counseling before you get pregnant.  Take 400 to 800 micrograms (mcg) of folic acid every day if you become pregnant.  Ask for birth control (contraception) if you want to prevent pregnancy. Osteoporosis and menopause Osteoporosis is a disease in which the bones lose minerals and strength with aging. This can result in bone fractures. If you are 80 years old or older, or if you are at risk for osteoporosis and fractures, ask your health care provider if you should:  Be screened for bone loss.  Take a calcium or vitamin D supplement to lower your risk of fractures.  Be given hormone replacement therapy (HRT) to treat symptoms of menopause. Follow these instructions at home: Lifestyle  Do not use any products that contain nicotine or tobacco, such as cigarettes, e-cigarettes, and chewing  tobacco. If you need help quitting, ask your health care provider.  Do not use street drugs.  Do not share needles.  Ask your health care provider for help if you need support or information about quitting drugs. Alcohol use  Do not drink alcohol if: ? Your health care provider tells you not to drink. ? You are pregnant, may be pregnant, or are planning to become pregnant.  If you drink alcohol: ? Limit how much you use to 0-1 drink a day. ? Limit intake if you are breastfeeding.  Be aware of how much alcohol is in your drink. In the [place] one drink equals one 12 oz bottle of beer (355 mL), one 5 oz glass of wine (148 mL), or one 1 oz glass of hard liquor (44 mL). General instructions  Schedule regular health, dental, and eye exams.  Stay current with your vaccines.  Tell your health care provider if: ? You often feel depressed. ? You have ever been abused or do not feel safe at home. Summary  Adopting a healthy lifestyle and getting preventive care are important in promoting health and wellness.  Follow your health care provider's instructions about healthy diet, exercising, and getting tested or screened for diseases.  Follow your health care provider's instructions on monitoring your cholesterol and blood pressure. This information is not intended to replace advice given to you by your health care provider. Make sure you discuss any questions you have with your health care provider. Document Revised: 01/26/2018 Document Reviewed: 01/26/2018 Elsevier Patient Education  2020 Elsevier Inc.  Hypertension, Adult High blood pressure (hypertension) is when the force  of blood pumping through the arteries is too strong. The arteries are the blood vessels that carry blood from the heart throughout the body. Hypertension forces the heart to work harder to pump blood and may cause arteries to become narrow or stiff. Untreated or uncontrolled hypertension can cause a heart attack,  heart failure, a stroke, kidney disease, and other problems. A blood pressure reading consists of a higher number over a lower number. Ideally, your blood pressure should be below 120/80. The first ("top") number is called the systolic pressure. It is a measure of the pressure in your arteries as your heart beats. The second ("bottom") number is called the diastolic pressure. It is a measure of the pressure in your arteries as the heart relaxes. What are the causes? The exact cause of this condition is not known. There are some conditions that result in or are related to high blood pressure. What increases the risk? Some risk factors for high blood pressure are under your control. The following factors may make you more likely to develop this condition:  Smoking.  Having type 2 diabetes mellitus, high cholesterol, or both.  Not getting enough exercise or physical activity.  Being overweight.  Having too much fat, sugar, calories, or salt (sodium) in your diet.  Drinking too much alcohol. Some risk factors for high blood pressure may be difficult or impossible to change. Some of these factors include:  Having chronic kidney disease.  Having a family history of high blood pressure.  Age. Risk increases with age.  Race. You may be at higher risk if you are African American.  Gender. Men are at higher risk than women before age 44. After age 28, women are at higher risk than men.  Having obstructive sleep apnea.  Stress. What are the signs or symptoms? High blood pressure may not cause symptoms. Very high blood pressure (hypertensive crisis) may cause:  Headache.  Anxiety.  Shortness of breath.  Nosebleed.  Nausea and vomiting.  Vision changes.  Severe chest pain.  Seizures. How is this diagnosed? This condition is diagnosed by measuring your blood pressure while you are seated, with your arm resting on a flat surface, your legs uncrossed, and your feet flat on the  floor. The cuff of the blood pressure monitor will be placed directly against the skin of your upper arm at the level of your heart. It should be measured at least twice using the same arm. Certain conditions can cause a difference in blood pressure between your right and left arms. Certain factors can cause blood pressure readings to be lower or higher than normal for a short period of time:  When your blood pressure is higher when you are in a health care provider's office than when you are at home, this is called white coat hypertension. Most people with this condition do not need medicines.  When your blood pressure is higher at home than when you are in a health care provider's office, this is called masked hypertension. Most people with this condition may need medicines to control blood pressure. If you have a high blood pressure reading during one visit or you have normal blood pressure with other risk factors, you may be asked to:  Return on a different day to have your blood pressure checked again.  Monitor your blood pressure at home for 1 week or longer. If you are diagnosed with hypertension, you may have other blood or imaging tests to help your health care provider understand  your overall risk for other conditions. How is this treated? This condition is treated by making healthy lifestyle changes, such as eating healthy foods, exercising more, and reducing your alcohol intake. Your health care provider may prescribe medicine if lifestyle changes are not enough to get your blood pressure under control, and if:  Your systolic blood pressure is above 130.  Your diastolic blood pressure is above 80. Your personal target blood pressure may vary depending on your medical conditions, your age, and other factors. Follow these instructions at home: Eating and drinking   Eat a diet that is high in fiber and potassium, and low in sodium, added sugar, and fat. An example eating plan is  called the DASH (Dietary Approaches to Stop Hypertension) diet. To eat this way: ? Eat plenty of fresh fruits and vegetables. Try to fill one half of your plate at each meal with fruits and vegetables. ? Eat whole grains, such as whole-wheat pasta, brown rice, or whole-grain bread. Fill about one fourth of your plate with whole grains. ? Eat or drink low-fat dairy products, such as skim milk or low-fat yogurt. ? Avoid fatty cuts of meat, processed or cured meats, and poultry with skin. Fill about one fourth of your plate with lean proteins, such as fish, chicken without skin, beans, eggs, or tofu. ? Avoid pre-made and processed foods. These tend to be higher in sodium, added sugar, and fat.  Reduce your daily sodium intake. Most people with hypertension should eat less than 1,500 mg of sodium a day.  Do not drink alcohol if: ? Your health care provider tells you not to drink. ? You are pregnant, may be pregnant, or are planning to become pregnant.  If you drink alcohol: ? Limit how much you use to:  0-1 drink a day for women.  0-2 drinks a day for men. ? Be aware of how much alcohol is in your drink. In the [place] one drink equals one 12 oz bottle of beer (355 mL), one 5 oz glass of wine (148 mL), or one 1 oz glass of hard liquor (44 mL). Lifestyle   Work with your health care provider to maintain a healthy body weight or to lose weight. Ask what an ideal weight is for you.  Get at least 30 minutes of exercise most days of the week. Activities may include walking, swimming, or biking.  Include exercise to strengthen your muscles (resistance exercise), such as Pilates or lifting weights, as part of your weekly exercise routine. Try to do these types of exercises for 30 minutes at least 3 days a week.  Do not use any products that contain nicotine or tobacco, such as cigarettes, e-cigarettes, and chewing tobacco. If you need help quitting, ask your health care provider.  Monitor your  blood pressure at home as told by your health care provider.  Keep all follow-up visits as told by your health care provider. This is important. Medicines  Take over-the-counter and prescription medicines only as told by your health care provider. Follow directions carefully. Blood pressure medicines must be taken as prescribed.  Do not skip doses of blood pressure medicine. Doing this puts you at risk for problems and can make the medicine less effective.  Ask your health care provider about side effects or reactions to medicines that you should watch for. Contact a health care provider if you:  Think you are having a reaction to a medicine you are taking.  Have headaches that keep coming back (  recurring).  Feel dizzy.  Have swelling in your ankles.  Have trouble with your vision. Get help right away if you:  Develop a severe headache or confusion.  Have unusual weakness or numbness.  Feel faint.  Have severe pain in your chest or abdomen.  Vomit repeatedly.  Have trouble breathing. Summary  Hypertension is when the force of blood pumping through your arteries is too strong. If this condition is not controlled, it may put you at risk for serious complications.  Your personal target blood pressure may vary depending on your medical conditions, your age, and other factors. For most people, a normal blood pressure is less than 120/80.  Hypertension is treated with lifestyle changes, medicines, or a combination of both. Lifestyle changes include losing weight, eating a healthy, low-sodium diet, exercising more, and limiting alcohol. This information is not intended to replace advice given to you by your health care provider. Make sure you discuss any questions you have with your health care provider. Document Revised: 10/13/2017 Document Reviewed: 10/13/2017 Elsevier Patient Education  2020 Elsevier Inc.      Edwina Barth, MD [facility]

## 2019-03-15 NOTE — Assessment & Plan Note (Signed)
Well-controlled.  Continue present medication.  No changes.  Follow-up in 6 months. 

## 2019-03-16 LAB — COMPREHENSIVE METABOLIC PANEL
ALT: 23 IU/L (ref 0–32)
AST: 23 IU/L (ref 0–40)
Albumin/Globulin Ratio: 1.6 (ref 1.2–2.2)
Albumin: 4.4 g/dL (ref 3.8–4.8)
Alkaline Phosphatase: 52 IU/L (ref 39–117)
BUN/Creatinine Ratio: 20 (ref 9–23)
BUN: 17 mg/dL (ref 6–24)
Bilirubin Total: 0.3 mg/dL (ref 0.0–1.2)
CO2: 22 mmol/L (ref 20–29)
Calcium: 9.5 mg/dL (ref 8.7–10.2)
Chloride: 101 mmol/L (ref 96–106)
Creatinine, Ser: 0.87 mg/dL (ref 0.57–1.00)
GFR calc Af Amer: 92 mL/min/{1.73_m2} (ref >59)
GFR calc non Af Amer: 80 mL/min/{1.73_m2} (ref >59)
Globulin, Total: 2.8 g/dL (ref 1.5–4.5)
Glucose: 98 mg/dL (ref 65–99)
Potassium: 4 mmol/L (ref 3.5–5.2)
Sodium: 138 mmol/L (ref 134–144)
Total Protein: 7.2 g/dL (ref 6.0–8.5)

## 2019-03-16 LAB — SAR COV2 SEROLOGY (COVID19)AB(IGG),IA: DiaSorin SARS-CoV-2 Ab, IgG: POSITIVE — AB

## 2019-03-16 LAB — LIPID PANEL
Chol/HDL Ratio: 3.5 ratio (ref 0.0–4.4)
Cholesterol, Total: 176 mg/dL (ref 100–199)
HDL: 50 mg/dL (ref >39)
LDL Chol Calc (NIH): 97 mg/dL (ref 0–99)
Triglycerides: 166 mg/dL — ABNORMAL HIGH (ref 0–149)
VLDL Cholesterol Cal: 29 mg/dL (ref 5–40)

## 2019-07-05 ENCOUNTER — Encounter: Payer: Self-pay | Admitting: Emergency Medicine

## 2019-07-05 ENCOUNTER — Ambulatory Visit (INDEPENDENT_AMBULATORY_CARE_PROVIDER_SITE_OTHER): Payer: Managed Care, Other (non HMO) | Admitting: Emergency Medicine

## 2019-07-05 ENCOUNTER — Other Ambulatory Visit: Payer: Self-pay

## 2019-07-05 ENCOUNTER — Ambulatory Visit (INDEPENDENT_AMBULATORY_CARE_PROVIDER_SITE_OTHER): Payer: Managed Care, Other (non HMO)

## 2019-07-05 VITALS — BP 111/73 | HR 72 | Temp 98.5°F | Ht <= 58 in | Wt 146.0 lb

## 2019-07-05 DIAGNOSIS — I10 Essential (primary) hypertension: Secondary | ICD-10-CM | POA: Diagnosis not present

## 2019-07-05 DIAGNOSIS — M25562 Pain in left knee: Secondary | ICD-10-CM

## 2019-07-05 MED ORDER — LISINOPRIL-HYDROCHLOROTHIAZIDE 10-12.5 MG PO TABS: 1.0000 | ORAL_TABLET | Freq: Every day | ORAL | 3 refills | Status: DC

## 2019-07-05 MED ORDER — MELOXICAM 7.5 MG PO TABS: 7.5000 mg | ORAL_TABLET | Freq: Every day | ORAL | 0 refills | Status: DC

## 2019-07-05 NOTE — Patient Instructions (Addendum)
If you have lab work done today you will be contacted with your lab results within the next 2 weeks.  If you have not heard from [place] then please contact us. The fastest way to get your results is to register for My Chart.   IF you received an x-ray today, you will receive an invoice from [facility]. Please contact [facility] at [phone] with questions or concerns regarding your invoice.   IF you received labwork today, you will receive an invoice from [place]. Please contact [facility] at [phone] with questions or concerns regarding your invoice.   Our billing staff will not be able to assist you with questions regarding bills from these companies.  You will be contacted with the lab results as soon as they are available. The fastest way to get your results is to activate your My Chart account. Instructions are located on the last page of this paperwork. If you have not heard from [place] regarding the results in 2 weeks, please contact this office.      ?au ??u g?i c?p tnh, Ng??i l?n Acute Knee Pain, Adult ?au ??u g?i c?p tnh x?y ra ??t ng?t v c th? do t?n th??ng, s?ng, ho?c kch thch c? v m nng ?? ??u g?i c?a qu v? gy ra. Th??ng t?n c th? do:  Ng.  Th??ng t?n ??u g?i c?a do cc ??ng tc v?n xo?n.  M?t c ?nh vo ??u g?i.  Nhi?m trng. ?au ??u g?i c?p tnh c th? t? h?t theo th?i gian v khi ngh? ng?i. N?u khng h?t ?au, chuyn gia ch?m [place] s?c kh?e c th? yu c?u lm cc ki?m tra ?? tm nguyn nhn gy ?au. Nh?ng ki?m tra ny c th? bao g?m:  Cc ki?m tra hnh ?nh, ch?ng h?n nh? ch?p x quang, ch?p c?ng h??ng t? (MRI), ho?c siu m.  Ht d?ch kh?p. Trong ki?m tra ny, d?ch ???c l?y ra ? kh?p g?i.  N?i soi kh?p. Trong ki?m tra ny, m?t ?ng c ?n ???c lu?n vo trong kh?p g?i v hnh ?nh ???c chi?u trn mn hnh TV.  Sinh thi?t. Trong ki?m tra ny, m?t m?u m ???c l?y ra kh?i c? th? v nghin c?u d??i knh hi?n vi. Tun th? nh?ng h??ng d?n  ny ? nh: Ch  ??n b?t c? thay ??i no v? tri?u ch?ng c?a qu v?. Th?c hi?n nh?ng hnh ??ng sau ?? lm gi?m ?au. N?u qu v? ?eo b?ng ?ai b?o v? ??u g?i ho?c n?p ??u g?i:   ?eo b?ng ?ai b?o v? ho?c n?p theo ch? d?n c?a chuyn gia ch?m [place] s?c kh?e. Ch? tho ra theo ch? d?n c?a chuyn gia ch?m [place] s?c kh?e.  N?i l?ng b?ng ?ai b?o v? ho??c n?p n?u cc ngn chn b? ?au bu?t, t ho?c tr?? nn l?nh v xanh ti.  Gi? cho b?ng ?ai b?o v? ho?c n?p s?ch s?.  N?u b?ng ?ai b?o v? ho?c n?p khng ph?i lo?i ch?ng th?m n??c: ? Khng ?? n b? ??t. ? Che n b?ng l?p ph? ch?ng th?m n??c khi qu v? t?m b?n ho?c t?m vi sen. Ho?t ??ng  ?? kh?p g?i ngh? ng?i.  Khng lm nh?ng vi?c gy ?au ho?c lm cho ?au tr?m tr?ng h?n.  Trnh cc ho?t ??ng ho??c ca?c ba?i t?p tc ??ng ma?nh, ch?ng h?n nh? ch?y, nh?y dy, ho?c nh?y jumping jacks.  Lm vi?c v?i m?t chuyn gia v?t l tr? li?u ?? l?p m?t ch??ng trnh t?p  luy?n an ton, theo khuy?n ngh? c?a chuyn gia ch?m [place] s?c kh?e. T?p theo cc bi t?p do bc s? v?t l tr? li?u ch? d?n. X? tr ?au, c?ng kh?p v s?ng n?   N?u ???c ch? d?n, ch??m ? l?nh vo ??u g?i: ? Cho ? l?nh vo ti ni lng. ? ?? kh?n t?m ? gi?a da v ti ch??m. ? Ch??m ? l?nh trong 20 pht, 2-3 l?n m?i ngy.  N?u ???c ch? d?n, s? d?ng b?ng thun ?? t?o p l?c (p) ln ??u g?i b? th??ng. Vi?c ny c th? ki?m sot s?ng n?, h? tr? v gip qu v? gi?m c?m gic kh ch?u. H??ng d?n chung  Ch? s? d?ng thu?c khng k ??n v thu?c k ??n theo ch? d?n c?a chuyn gia ch?m [place] s?c kh?e.  Nng (nng cao) ??u g?i quy? vi? ln cao h?n m?c tim khi qu v? ng?i ho?c n?m.  ??t g?i d??i ??u g?i trong khi ng?.  Khng s? d?ng b?t k? s?n ph?m no c nicotine ho?c thu?c l, ch?ng ha?n nh? thu?c l d?ng ht, thu?c l ?i?n t? v thu?c l d?ng nhai. Nh?ng s?n ph?m ny c th? lm ch?m qu trnh lnh v?t th??ng. N?u qu v? c?n gip ?? ?? cai thu?c, hy h?i chuyn gia ch?m [place] s?c kh?e.  N?u qu v? b? th?a cn,  hy la?m vi?c v??i chuyn gia ch?m so?c s??c kho?e va? chuyn gia dinh d???ng ?? ???t mu?c tiu gia?m cn co? l??i cho s??c kho?e va? h??p ly? v??i quy? vi?. Tr?ng l??ng t?ng c th? ta?o p l?c ln ??u g?i c?a quy? vi?.  Tun th? t?t c? cc l?n khm theo di theo ch? d?n c?a chuyn gia ch?m [place] s?c kh?e. ?i?u ny c vai tr quan tr?ng. Hy lin l?c v?i chuyn gia ch?m [place] s?c kh?e n?u:  ?au ??u g?i ti?p t?c, thay ??i ho?c tr?? nn tr?m tr?ng h?n.  Quy? vi? bi? s?t km theo ?au ??u g?i.  C?m th?y ??u g?i qu v? ?m khi s? vo.  ??u g?i c?a quy? vi? th?t l?i ho?c bi? kho?a ch??t. Yu c?u tr? gip ngay l?p t?c n?u:  ??u g?i qu v? s?ng ln v tnh tr?ng s?ng tr? ln tr?m tr?ng h?n.  Qu v? khng th? c? ??ng ??u g?i.  Qu v? b? ?au d? d?i ? ??u g?i. Tm t?t  ?au ??u g?i c?p tnh c th? l do m?t c ng, ch?n th??ng, nhi?m trng, t?n th??ng, s?ng, ho?c kch thch m nng ?? ??u g?i c?a qu v?.  Chuyn gia ch?m [place] s?c kh?e c th? th?c hi?n cc ki?m tra ?? tm nguyn nhn gy ra c?n ?au.  Ch  ??n b?t c? thay ??i no v? tri?u ch?ng c?a qu v?. Gi?m ?au b?ng cch ngh? ng?i, dng thu?c, ho?t ??ng nh? nhng v s? d?ng ? l?nh.  Tm s? tr? gip n?u ?au ti?p t?c ho?c tr? nn tr?m tr?ng h?n, ??u g?i c?a qu v? s?ng, ho?c qu v? khng th? c? ??ng ??u g?i. Thng tin ny khng nh?m m?c ?ch thay th? cho l?i khuyn m chuyn gia ch?m [place] s?c kh?e ni v?i qu v?. Hy b?o ??m qu v? ph?i th?o lu?n b?t k? v?n ?? g m qu v? c v?i chuyn gia ch?m [place] s?c kh?e c?a qu v?. Document Revised: 09/10/2017 Document Reviewed: 09/10/2017 Elsevier Patient Education  2020 [employer].

## 2019-07-05 NOTE — Progress Notes (Signed)
[name] 49 y.o.   Chief Complaint  Patient presents with  . left knee pain    2-3 m and a fall in march made the pain worse     HISTORY OF PRESENT ILLNESS: This is a 49 y.o. female complaining of pain to her left knee that started about 2 months ago.  Denies injury.  Constant sharp pain worse with pressure/ambulation.  No other significant symptoms.  HPI   Prior to Admission medications   Medication Sig Start Date End Date Taking? Authorizing Provider  lisinopril-hydrochlorothiazide (ZESTORETIC) 10-12.5 MG tablet TAKE 1 TABLET BY MOUTH EVERY DAY 03/14/19  Yes Oryon Gary, Eilleen Kempf, MD    Allergies  Allergen Reactions  . Shellfish Allergy Anaphylaxis and Swelling    All over body  . Chicken (Grilled) Flavor [Flavoring Agent] Swelling    All over body    Patient Active Problem List   Diagnosis Date Noted  . Chronic tension-type headache, not intractable 03/15/2018  . Essential hypertension 03/26/2010    Past Medical History:  Diagnosis Date  . Hypertension     Past Surgical History:  Procedure Laterality Date  . CESAREAN SECTION    . TUBAL LIGATION      Social History   Socioeconomic History  . Marital status: Married    Spouse name: Not on file  . Number of children: Not on file  . Years of education: Not on file  . Highest education level: Not on file  Occupational History  . Not on file  Tobacco Use  . Smoking status: Never Smoker  . Smokeless tobacco: Never Used  Substance and Sexual Activity  . Alcohol use: No    Alcohol/week: 0.0 standard drinks  . Drug use: No  . Sexual activity: Not on file  Other Topics Concern  . Not on file  Social History Narrative  . Not on file   Social Determinants of Health   Financial Resource Strain:   . Difficulty of Paying Living Expenses:   Food Insecurity:   . Worried About Programme researcher, broadcasting/film/video in the Last Year:   . Barista in the Last Year:   Transportation Needs:   . Freight forwarder  (Medical):   Marland Kitchen Lack of Transportation (Non-Medical):   Physical Activity:   . Days of Exercise per Week:   . Minutes of Exercise per Session:   Stress:   . Feeling of Stress :   Social Connections:   . Frequency of Communication with Friends and Family:   . Frequency of Social Gatherings with Friends and Family:   . Attends Religious Services:   . Active Member of Clubs or Organizations:   . Attends Banker Meetings:   Marland Kitchen Marital Status:   Intimate Partner Violence:   . Fear of Current or Ex-Partner:   . Emotionally Abused:   Marland Kitchen Physically Abused:   . Sexually Abused:     History reviewed. No pertinent family history.   Review of Systems  Constitutional: Negative.  Negative for chills and fever.  HENT: Negative.  Negative for congestion and sore throat.   Respiratory: Negative.  Negative for cough and shortness of breath.   Cardiovascular: Negative.  Negative for chest pain and palpitations.  Gastrointestinal: Negative for abdominal pain, blood in stool, melena, nausea and vomiting.  Genitourinary: Negative.  Negative for dysuria.  Musculoskeletal: Positive for joint pain (Left knee). Negative for myalgias and neck pain.  Skin: Negative.  Negative for rash.  Neurological: Negative  for dizziness and headaches.  Psychiatric/Behavioral: Positive for memory loss.  All other systems reviewed and are negative.   Vitals:   07/05/19 0906  BP: 111/73  Pulse: 72  Temp: 98.5 F (36.9 C)  SpO2: 94%    Physical Exam Vitals reviewed.  Constitutional:      Appearance: Normal appearance.  HENT:     Head: Normocephalic.  Cardiovascular:     Rate and Rhythm: Normal rate.  Pulmonary:     Effort: Pulmonary effort is normal.  Musculoskeletal:     Cervical back: Normal range of motion.     Comments: Left knee: No erythema or bruising.  Mild swelling.  No tenderness.  Full range of motion.  Stable in flexion and extension. Rest of left lower extremity: Neurovascularly  intact.  No signs of DVT.  Skin:    General: Skin is warm and dry.  Neurological:     General: No focal deficit present.     Mental Status: She is alert and oriented to person, place, and time.  Psychiatric:        Mood and Affect: Mood normal.        Behavior: Behavior normal.    DG Knee Complete 4 Views Left  Result Date: 07/05/2019 CLINICAL DATA:  Pain EXAM: LEFT KNEE - COMPLETE 4+ VIEW COMPARISON:  None. FINDINGS: Frontal, tunnel, lateral, and sunrise patellar images were obtained. No fracture or dislocation. No joint effusion. Joint spaces appear normal. No erosive change. IMPRESSION: No fracture, dislocation, or joint effusion. No evident arthropathy. Electronically Signed   By: Bretta Bang III M.D.   On: 07/05/2019 09:30     ASSESSMENT & PLAN: [name] was seen today for left knee pain.  Diagnoses and all orders for this visit:  Acute pain of left knee -     DG Knee Complete 4 Views Left -     Ambulatory referral to Orthopedic Surgery -     meloxicam (MOBIC) 7.5 MG tablet; Take 1 tablet (7.5 mg total) by mouth daily for 7 days.  Essential hypertension -     lisinopril-hydrochlorothiazide (ZESTORETIC) 10-12.5 MG tablet; Take 1 tablet by mouth daily.    Patient Instructions       If you have lab work done today you will be contacted with your lab results within the next 2 weeks.  If you have not heard from [place] then please contact us. The fastest way to get your results is to register for My Chart.   IF you received an x-ray today, you will receive an invoice from [facility]. Please contact [facility] at [phone] with questions or concerns regarding your invoice.   IF you received labwork today, you will receive an invoice from [place]. Please contact [facility] at [phone] with questions or concerns regarding your invoice.   Our billing staff will not be able to assist you with questions regarding bills from these companies.  You will  be contacted with the lab results as soon as they are available. The fastest way to get your results is to activate your My Chart account. Instructions are located on the last page of this paperwork. If you have not heard from [place] regarding the results in 2 weeks, please contact this office.      ?au ??u g?i c?p tnh, Ng??i l?n Acute Knee Pain, Adult ?au ??u g?i c?p tnh x?y ra ??t ng?t v c th? do t?n th??ng, s?ng, ho?c kch thch c? v m nng ?? ??u g?i c?a qu  v? gy ra. Th??ng t?n c th? do:  Ng.  Th??ng t?n ??u g?i c?a do cc ??ng tc v?n xo?n.  M?t c ?nh vo ??u g?i.  Nhi?m trng. ?au ??u g?i c?p tnh c th? t? h?t theo th?i gian v khi ngh? ng?i. N?u khng h?t ?au, chuyn gia ch?m [place] s?c kh?e c th? yu c?u lm cc ki?m tra ?? tm nguyn nhn gy ?au. Nh?ng ki?m tra ny c th? bao g?m:  Cc ki?m tra hnh ?nh, ch?ng h?n nh? ch?p x quang, ch?p c?ng h??ng t? (MRI), ho?c siu m.  Ht d?ch kh?p. Trong ki?m tra ny, d?ch ???c l?y ra ? kh?p g?i.  N?i soi kh?p. Trong ki?m tra ny, m?t ?ng c ?n ???c lu?n vo trong kh?p g?i v hnh ?nh ???c chi?u trn mn hnh TV.  Sinh thi?t. Trong ki?m tra ny, m?t m?u m ???c l?y ra kh?i c? th? v nghin c?u d??i knh hi?n vi. Tun th? nh?ng h??ng d?n ny ? nh: Ch  ??n b?t c? thay ??i no v? tri?u ch?ng c?a qu v?. Th?c hi?n nh?ng hnh ??ng sau ?? lm gi?m ?au. N?u qu v? ?eo b?ng ?ai b?o v? ??u g?i ho?c n?p ??u g?i:   ?eo b?ng ?ai b?o v? ho?c n?p theo ch? d?n c?a chuyn gia ch?m [place] s?c kh?e. Ch? tho ra theo ch? d?n c?a chuyn gia ch?m [place] s?c kh?e.  N?i l?ng b?ng ?ai b?o v? ho??c n?p n?u cc ngn chn b? ?au bu?t, t ho?c tr?? nn l?nh v xanh ti.  Gi? cho b?ng ?ai b?o v? ho?c n?p s?ch s?.  N?u b?ng ?ai b?o v? ho?c n?p khng ph?i lo?i ch?ng th?m n??c: ? Khng ?? n b? ??t. ? Che n b?ng l?p ph? ch?ng th?m n??c khi qu v? t?m b?n ho?c t?m vi sen. Ho?t ??ng  ?? kh?p g?i ngh? ng?i.  Khng lm nh?ng vi?c gy ?au ho?c lm cho  ?au tr?m tr?ng h?n.  Trnh cc ho?t ??ng ho??c ca?c ba?i t?p tc ??ng ma?nh, ch?ng h?n nh? ch?y, nh?y dy, ho?c nh?y jumping jacks.  Lm vi?c v?i m?t chuyn gia v?t l tr? li?u ?? l?p m?t ch??ng trnh t?p luy?n an ton, theo khuy?n ngh? c?a chuyn gia ch?m [place] s?c kh?e. T?p theo cc bi t?p do bc s? v?t l tr? li?u ch? d?n. X? tr ?au, c?ng kh?p v s?ng n?   N?u ???c ch? d?n, ch??m ? l?nh vo ??u g?i: ? Cho ? l?nh vo ti ni lng. ? ?? kh?n t?m ? gi?a da v ti ch??m. ? Ch??m ? l?nh trong 20 pht, 2-3 l?n m?i ngy.  N?u ???c ch? d?n, s? d?ng b?ng thun ?? t?o p l?c (p) ln ??u g?i b? th??ng. Vi?c ny c th? ki?m sot s?ng n?, h? tr? v gip qu v? gi?m c?m gic kh ch?u. H??ng d?n chung  Ch? s? d?ng thu?c khng k ??n v thu?c k ??n theo ch? d?n c?a chuyn gia ch?m [place] s?c kh?e.  Nng (nng cao) ??u g?i quy? vi? ln cao h?n m?c tim khi qu v? ng?i ho?c n?m.  ??t g?i d??i ??u g?i trong khi ng?.  Khng s? d?ng b?t k? s?n ph?m no c nicotine ho?c thu?c l, ch?ng ha?n nh? thu?c l d?ng ht, thu?c l ?i?n t? v thu?c l d?ng nhai. Nh?ng s?n ph?m ny c th? lm ch?m qu trnh lnh v?t th??ng. N?u qu v? c?n gip ?? ?? cai thu?c, hy h?i [place]  gia ch?m [place] s?c kh?e.  N?u qu v? b? th?a cn, hy la?m vi?c v??i chuyn gia ch?m so?c s??c kho?e va? chuyn gia dinh d???ng ?? ???t mu?c tiu gia?m cn co? l??i cho s??c kho?e va? h??p ly? v??i quy? vi?. Tr?ng l??ng t?ng c th? ta?o p l?c ln ??u g?i c?a quy? vi?.  Tun th? t?t c? cc l?n khm theo di theo ch? d?n c?a chuyn gia ch?m [place] s?c kh?e. ?i?u ny c vai tr quan tr?ng. Hy lin l?c v?i chuyn gia ch?m [place] s?c kh?e n?u:  ?au ??u g?i ti?p t?c, thay ??i ho?c tr?? nn tr?m tr?ng h?n.  Quy? vi? bi? s?t km theo ?au ??u g?i.  C?m th?y ??u g?i qu v? ?m khi s? vo.  ??u g?i c?a quy? vi? th?t l?i ho?c bi? kho?a ch??t. Yu c?u tr? gip ngay l?p t?c n?u:  ??u g?i qu v? s?ng ln v tnh tr?ng s?ng tr? ln tr?m tr?ng h?n.  Qu v?  khng th? c? ??ng ??u g?i.  Qu v? b? ?au d? d?i ? ??u g?i. Tm t?t  ?au ??u g?i c?p tnh c th? l do m?t c ng, ch?n th??ng, nhi?m trng, t?n th??ng, s?ng, ho?c kch thch m nng ?? ??u g?i c?a qu v?.  Chuyn gia ch?m [place] s?c kh?e c th? th?c hi?n cc ki?m tra ?? tm nguyn nhn gy ra c?n ?au.  Ch  ??n b?t c? thay ??i no v? tri?u ch?ng c?a qu v?. Gi?m ?au b?ng cch ngh? ng?i, dng thu?c, ho?t ??ng nh? nhng v s? d?ng ? l?nh.  Tm s? tr? gip n?u ?au ti?p t?c ho?c tr? nn tr?m tr?ng h?n, ??u g?i c?a qu v? s?ng, ho?c qu v? khng th? c? ??ng ??u g?i. Thng tin ny khng nh?m m?c ?ch thay th? cho l?i khuyn m chuyn gia ch?m [place] s?c kh?e ni v?i qu v?. Hy b?o ??m qu v? ph?i th?o lu?n b?t k? v?n ?? g m qu v? c v?i chuyn gia ch?m [place] s?c kh?e c?a qu v?. Document Revised: 09/10/2017 Document Reviewed: 09/10/2017 Elsevier Patient Education  2020 Elsevier Inc.      Agustina Caroli, MD [facility]

## 2019-07-12 ENCOUNTER — Encounter: Payer: Self-pay | Admitting: Family Medicine

## 2019-07-12 ENCOUNTER — Ambulatory Visit (INDEPENDENT_AMBULATORY_CARE_PROVIDER_SITE_OTHER): Payer: Managed Care, Other (non HMO) | Admitting: Family Medicine

## 2019-07-12 ENCOUNTER — Other Ambulatory Visit: Payer: Self-pay

## 2019-07-12 DIAGNOSIS — M79642 Pain in left hand: Secondary | ICD-10-CM

## 2019-07-12 DIAGNOSIS — M25562 Pain in left knee: Secondary | ICD-10-CM | POA: Diagnosis not present

## 2019-07-12 DIAGNOSIS — G8929 Other chronic pain: Secondary | ICD-10-CM | POA: Diagnosis not present

## 2019-07-12 DIAGNOSIS — M79641 Pain in right hand: Secondary | ICD-10-CM

## 2019-07-12 DIAGNOSIS — M79671 Pain in right foot: Secondary | ICD-10-CM | POA: Diagnosis not present

## 2019-07-12 DIAGNOSIS — M79672 Pain in left foot: Secondary | ICD-10-CM

## 2019-07-12 MED ORDER — MELOXICAM 15 MG PO TABS: 7.5000 mg | ORAL_TABLET | Freq: Every day | ORAL | 6 refills | Status: DC | PRN

## 2019-07-12 NOTE — Patient Instructions (Addendum)
    Take meloxicam daily for about 2 more weeks until knee no longer feels swollen, and then stop.  If knee and hands start hurting and swelling again, call me and I will order blood tests and I might drain the knee and inject with cortisone.

## 2019-07-12 NOTE — Progress Notes (Signed)
Office Visit Note   Patient: [name]           Date of Birth: [birth year]           MRN: [id number] Visit Date: 07/12/2019 Requested by: Horald Pollen, MD [place],  [place] [id number] PCP: Horald Pollen, MD  Subjective: Chief Complaint  Patient presents with  . Left Knee - Pain    Pain all over knee x several months, but pain was made worse when she fell while getting out of the bed in March - landed directly on the knee. Feeling some better with stretches & medication - can straighten leg out now.    HPI: She is here with left knee pain.  She also reports bilateral hand and bilateral heel pain, but the main reason for her visit is her left knee.  She is accompanied by her daughter who translates for her today.  Several months ago she started noticing pain in the left knee without injury.  Pain when trying to fully extend the knee, making it difficult to sleep at night.  In March she fell out of bed and landed on her knee which made her pain significantly worse.  Recently she saw her PCP who gave her meloxicam and it has helped substantially with her pain.  She is now able to fully extend it although it is still not feeling totally normal.  Also in the past several months she has had intermittent swelling in her fingers, particularly the right hand third PIP joint.  They feel stiff as well.  In addition, she has had pain on the plantar aspect of her heels when getting up from a seated position to walk.  She has to wait a minute before she can walk without limping.  Family history is largely unknown.  No definite history of gout or other rheumatologic disease.  Patient has been in good health.               ROS: No fevers or chills.  All other systems were reviewed and are negative.  Objective: Vital Signs: LMP 06/14/2019   Physical Exam:  General:  Alert and oriented, in no acute distress. Pulm:  Breathing unlabored. Psy:  Normal mood, congruent affect.  Skin: No rash or erythema Hands: She has no synovitis today, good range of motion of the fingers. Left knee: 1+ effusion with no warmth.  Full active extension with pain at the extreme.  Negative patella compression, negative patellar apprehension, no significant patellofemoral crepitus.  Ligaments feel stable.  She has flexion to about 140 degrees.  Slightly tender on the medial joint line with no palpable click on McMurray's. Heels: She has bilateral tight heel cords.  She is tender at the medial origin of the plantar fascia bilaterally.   Imaging: None today.  Recent knee x-rays were reviewed on computer and are unremarkable, no significant degenerative changes.   Assessment & Plan: 1.  Improved left knee pain and effusion, etiology uncertain.  Cannot rule out crystal arthropathy or rheumatologic disease. -She will continue with meloxicam for another couple weeks and then stop medication when feeling better.  If she has a recurrence of symptoms, she will contact me and we will order labs and possibly aspirate and inject her knee.  If labs were normal and symptoms persisted, then MRI scan.  2.  Bilateral hand swelling -Meloxicam as above, labs if symptoms recur.  3.  Bilateral plantar fasciitis -Home stretching exercises given.  Procedures: No procedures performed  No notes on file     PMFS History: Patient Active Problem List   Diagnosis Date Noted  . Chronic tension-type headache, not intractable 03/15/2018  . Essential hypertension 03/26/2010   Past Medical History:  Diagnosis Date  . Hypertension     History reviewed. No pertinent family history.  Past Surgical History:  Procedure Laterality Date  . CESAREAN SECTION    . TUBAL LIGATION     Social History   Occupational History  . Not on file  Tobacco Use  . Smoking status: Never Smoker  . Smokeless tobacco: Never Used  Substance and Sexual Activity  . Alcohol use: No    Alcohol/week: 0.0 standard drinks   . Drug use: No  . Sexual activity: Not on file

## 2019-09-07 ENCOUNTER — Ambulatory Visit: Payer: Managed Care, Other (non HMO) | Admitting: Emergency Medicine

## 2019-09-20 ENCOUNTER — Other Ambulatory Visit: Payer: Self-pay

## 2019-09-20 ENCOUNTER — Encounter: Payer: Self-pay | Admitting: Emergency Medicine

## 2019-09-20 ENCOUNTER — Ambulatory Visit (INDEPENDENT_AMBULATORY_CARE_PROVIDER_SITE_OTHER): Payer: Managed Care, Other (non HMO) | Admitting: Emergency Medicine

## 2019-09-20 DIAGNOSIS — I10 Essential (primary) hypertension: Secondary | ICD-10-CM | POA: Diagnosis not present

## 2019-09-20 MED ORDER — LISINOPRIL-HYDROCHLOROTHIAZIDE 10-12.5 MG PO TABS: 1.0000 | ORAL_TABLET | Freq: Every day | ORAL | 3 refills | Status: DC

## 2019-09-20 NOTE — Progress Notes (Signed)
[name] 49 y.o.   Chief Complaint  Patient presents with  . Hypertension    follow up 6 months  . Medication Refill    Lisinopril-hctz    HISTORY OF PRESENT ILLNESS: This is a 49 y.o. female with history of hypertension here for follow-up.  On lisinopril-hydrochlorothiazide 10-12.5 mg daily. No complaints or medical concerns.  Needs medication refill. Has history of pain to her left knee with occasional swelling.  Took meloxicam for several weeks.  Off medication now.  Saw orthopedic surgeon on 07/12/2019.  Much better.  Swelling and pain much improved. BP Readings from Last 3 Encounters:  09/20/19 127/74  07/05/19 111/73  03/15/19 104/68    HPI   Prior to Admission medications   Medication Sig Start Date End Date Taking? Authorizing Provider  lisinopril-hydrochlorothiazide (ZESTORETIC) 10-12.5 MG tablet Take 1 tablet by mouth daily. 07/05/19  Yes Jamee Pacholski, Eilleen Kempf, MD  meloxicam (MOBIC) 15 MG tablet Take 0.5-1 tablets (7.5-15 mg total) by mouth daily as needed for pain. Patient not taking: Reported on 09/20/2019 07/12/19   Lavada Mesi, MD    Allergies  Allergen Reactions  . Shellfish Allergy Anaphylaxis and Swelling    All over body  . Chicken (Grilled) Flavor [Flavoring Agent] Swelling    All over body    Patient Active Problem List   Diagnosis Date Noted  . Chronic tension-type headache, not intractable 03/15/2018  . Essential hypertension 03/26/2010    Past Medical History:  Diagnosis Date  . Hypertension     Past Surgical History:  Procedure Laterality Date  . CESAREAN SECTION    . TUBAL LIGATION      Social History   Socioeconomic History  . Marital status: Married    Spouse name: Not on file  . Number of children: Not on file  . Years of education: Not on file  . Highest education level: Not on file  Occupational History  . Not on file  Tobacco Use  . Smoking status: Never Smoker  . Smokeless tobacco: Never Used  Substance and Sexual  Activity  . Alcohol use: No    Alcohol/week: 0.0 standard drinks  . Drug use: No  . Sexual activity: Not on file  Other Topics Concern  . Not on file  Social History Narrative  . Not on file   Social Determinants of Health   Financial Resource Strain:   . Difficulty of Paying Living Expenses:   Food Insecurity:   . Worried About Programme researcher, broadcasting/film/video in the Last Year:   . Barista in the Last Year:   Transportation Needs:   . Freight forwarder (Medical):   Marland Kitchen Lack of Transportation (Non-Medical):   Physical Activity:   . Days of Exercise per Week:   . Minutes of Exercise per Session:   Stress:   . Feeling of Stress :   Social Connections:   . Frequency of Communication with Friends and Family:   . Frequency of Social Gatherings with Friends and Family:   . Attends Religious Services:   . Active Member of Clubs or Organizations:   . Attends Banker Meetings:   Marland Kitchen Marital Status:   Intimate Partner Violence:   . Fear of Current or Ex-Partner:   . Emotionally Abused:   Marland Kitchen Physically Abused:   . Sexually Abused:     History reviewed. No pertinent family history.   Review of Systems  Constitutional: Negative.  Negative for chills and fever.  HENT:  Negative.  Negative for congestion and sore throat.   Respiratory: Negative.  Negative for shortness of breath.   Cardiovascular: Negative.  Negative for chest pain and palpitations.  Gastrointestinal: Negative for abdominal pain, diarrhea, nausea and vomiting.  Genitourinary: Negative.  Negative for dysuria and hematuria.  Musculoskeletal: Positive for joint pain.  Skin: Negative.  Negative for rash.  Neurological: Negative.  Negative for dizziness and headaches.  All other systems reviewed and are negative.   Vitals:   09/20/19 1626  BP: 127/74  Pulse: 60  Resp: 16  Temp: 98.3 F (36.8 C)  SpO2: 94%    Physical Exam Vitals reviewed.  Constitutional:      Appearance: Normal appearance.    HENT:     Head: Normocephalic.     Mouth/Throat:     Mouth: Mucous membranes are moist.     Pharynx: Oropharynx is clear.  Eyes:     Extraocular Movements: Extraocular movements intact.     Pupils: Pupils are equal, round, and reactive to light.  Cardiovascular:     Rate and Rhythm: Normal rate and regular rhythm.     Pulses: Normal pulses.     Heart sounds: Normal heart sounds.  Pulmonary:     Effort: Pulmonary effort is normal.     Breath sounds: Normal breath sounds.  Musculoskeletal:        General: Normal range of motion.     Cervical back: Normal range of motion and neck supple.  Skin:    General: Skin is warm and dry.     Capillary Refill: Capillary refill takes less than 2 seconds.  Neurological:     General: No focal deficit present.     Mental Status: She is alert and oriented to person, place, and time.  Psychiatric:        Mood and Affect: Mood normal.        Behavior: Behavior normal.      ASSESSMENT & PLAN: Essential hypertension Well-controlled hypertension.  Continue present medication.  No changes.  Follow-up in 6 months.   Peace was seen today for hypertension and medication refill.  Diagnoses and all orders for this visit:  Essential hypertension -     lisinopril-hydrochlorothiazide (ZESTORETIC) 10-12.5 MG tablet; Take 1 tablet by mouth daily.    Patient Instructions       If you have lab work done today you will be contacted with your lab results within the next 2 weeks.  If you have not heard from [place] then please contact us. The fastest way to get your results is to register for My Chart.   IF you received an x-ray today, you will receive an invoice from [facility]. Please contact [facility] at [phone] with questions or concerns regarding your invoice.   IF you received labwork today, you will receive an invoice from [facility]. Please contact [facility] at [phone] with questions or concerns regarding your  invoice.   Our billing staff will not be able to assist you with questions regarding bills from these companies.  You will be contacted with the lab results as soon as they are available. The fastest way to get your results is to activate your My Chart account. Instructions are located on the last page of this paperwork. If you have not heard from [place] regarding the results in 2 weeks, please contact this office.     T?ng huy?t p, Ng??i l?n Hypertension, Adult Huy?t p cao (t?ng huy?t p) l khi l?c b?m mu qua ??  ng m?ch qu m?nh. ??ng m?ch l cc m?ch mu mang mu t? tim ?i kh?p c? th?. T?ng huy?t p khi?n tim lm vi?c v?t v? h?n ?? b?m mu v c th? khi?n cc ??ng m?ch tr? ln h?p ho?c c?ng. T?ng huy?t p khng ???c ?i?u tr? ho?c ki?m sot c th? d?n t?i nh?i mu c? tim, suy tim, ??t qu?, b?nh th?n v nh?ng v?n ?? khc. Ch? s? ?o huy?t p g?m m?t ch? s? cao trn m?t ch? s? th?p. L t??ng l huy?t a?p cu?a quy? vi? c?n ph?i d??i 120/80. Ch? s? ??u tin ("??nh") ???c g?i l huy?t p tm thu. ?y l s? ?o p su?t trong ??ng m?ch khi tim qu v? ??p. Ch? s? th? hai ("?y") ???c g?i l huy?t p tm tr??ng. ?y l s? ?o p su?t trong ??ng m?ch khi tim qu v? ngh?[place] nhn g gy ra? Khng r nguyn nhn chnh xc gy ra tnh tr?ng ny. C m?t s? tnh tr?ng d?n ??n ho?c c lin quan ??n huy?t p cao. ?i?u g lm t?ng nguy c?? M?t s? y?u t? nguy c? d?n ??n huy?t p cao c th? ki?m sot ???c. Nh?ng y?u t? sau c th? lm qu v? d? b? tnh tr?ng ny h?n:  Ht thu?c.  B? b?nh ti?u ???ng tup 2, cholesterol cao, ho?c c? hai.  Khng t?p th? d?c ho?c cc ho?t ??ng th? ch?t ??y ??Marland Kitchen  Th?a cn.  ?n qu nhi?u ch?t bo, ???ng, ca-lo, ho?c mu?i (Natri).  U?ng qu nhi?u r??u. M?t s? y?u t? nguy c? b? huy?t p cao c th? kh ho?c khng th? thay ??i. M?t s? y?u t? trong s? ny bao g?m:  B?nh th?n m?n tnh.  C ti?n s? gia ?nh b? cao huy?t p.  ?? tu?i. Nguy c? t?ng ln theo ?? tu?i.  Ch?ng t?c. Qu  v? c nguy c? cao h?n n?u qu v? l ng??i M? g?c Phi.  Gi?i tnh. Nam gi?i c nguy c? cao h?n ph? n? tr??c tu?i 45. Sau tu?i 66, ph? n? c nguy c? cao h?n nam gi?i.  Ng?ng th? khi ng? do t?c ngh?n.  C?ng th?ng. Cc d?u hi?u ho?c tri?u ch?ng g? Huy?t p cao c th? khng gy ra cc tri?u ch?ng. Huy?t p r?t cao (c?n t?ng huy?t p) c th? gy ra:  ?au ??u.  Lo u.  Kh th?.  Ch?y mu cam.  Bu?n nn v nn m?a.  Th? l?c thay ??i.  ?au ng?c d? d?i.  Co gi?t. Ch?n ?on tnh tr?ng ny nh? th? no? Tnh tr?ng ny ???c ch?n ?on b?ng cch ?o huy?t p c?a qu v? lc qu v? ng?i, ?? tay trn m?t b? m?t ph?ng, hai chn khng b?t cho, v hai bn chn b?ng ph?ng trn sn. B?ng qu?n thi?t b? ?o huy?t p s? ???c qu?n tr?c ti?p vo vng da cnh tay pha trn c?a qu v? ngang v?i m?c tim. Huy?t p c?n ???c ?o t nh?t hai l?n trn cng m?t cnh tay. M?t s? tnh tr?ng nh?t ??nh c th? lm cho huy?t p khc nhau gi?a tay ph?i v tay tri c?a qu v?. M?t s? y?u t? nh?t ??nh c th? khi?n ch? s? ?o huy?t p th?p h?n ho?c cao h?n so v?i bnh th??ng trong th?i gian ng?n:  Khi huy?t p c?a qu v? ? phng khm c?a chuyn gia ch?m [place] s?c kh?e cao h?n so v?i lc qu v? ? nh, hi?n  t??ng ny ???c g?i l t?ng huy?t p o chong tr?ng. H?u h?t nh?ng ng??i b? tnh tr?ng ny ??u khng c?n dng thu?c.  Khi huy?t p c?a qu v? lc ? nh cao h?n so v?i lc qu v? ? phng khm chuyn gia ch?m [place] s?c kh?e, hi?n t??ng ny ???c g?i l t?ng huy?t p ?n. H?u h?t nh?ng ng??i b? tnh tr?ng ny ??u c th? c?n dng thu?c ?? ki?m sot huy?t p. N?u qu v? c ch? s? huy?t p cao trong m?t l?n khm ho?c qu v? c huy?t p bnh th??ng c km cc y?u t? nguy c? khc, qu v? c th? ???c yu c?u:  Tr? l?i vo m?t ngy khc ?? ki?m tra l?i huy?t p.  Theo di huy?t p t?i nh trong vng 1 tu?n ho?c lu h?n. N?u qu v? ???c ch?n ?on b? t?ng huy?t p, qu v? c th? c?n th?c hi?n cc xt nghi?m mu ho?c ki?m tra hnh ?nh khc ??  gip chuyn gia ch?m [place] s?c kh?e hi?u nguy c? t?ng th? m?c cc b?nh tr?ng khc. Tnh tr?ng ny ???c ?i?u tr? nh? th? no? Tnh tr?ng ny ???c ?i?u tr? b?ng cch thay ??i l?i s?ng lnh m?nh, ch?ng h?nh nh? ?n th?c ph?m c l?i cho s?c kh?e, t?p th? d?c nhi?u h?n v gi?m l??ng r??u u?ng vo. N?u thay ??i l?i s?ng khng ?? ?? ??a huy?t p v? m?c c th? ki?m sot ???c, chuyn gia ch?m [place] s?c kh?e c th? k ??n thu?c, v n?u:  Huy?t p tm thu c?a qu v? trn 130.  Huy?t p tm tr??ng c?a qu v? trn 80. Huy?t p m?c tiu c nhn c?a qu v? c th? khc nhau ty thu?c v tnh tr?ng b?nh l, tu?i v cc nhn t? khc. Tun th? nh?ng h??ng d?n ny ? nh: ?n v u?ng   ?n ch? ?? giu ch?t x? v kali v t natri, ???ng ph? gia v ch?t bo. M?t k? ho?ch ?n m?u c tn l ch? ?? ?n DASH (Cch ti?p c?n ch? ?? ?n u?ng ?? ng?n ch?n t?ng huy?t p). ?n theo cch ny: ? ?n nhi?u tri cy v rau t??i. Vo m?i b?a ?n, c? g?ng dnh m?t n?a ??a cho tri cy v rau c?. ? ?n ng? c?c nguyn cm, ch?ng h?n nh? m ?ng lm t? b?t m nguyn cm, g?o l?t, ho?c bnh m t? b?t m nguyn cm. Cho ngu? c?c nguyn ca?m va?o kho?ng m?t ph?n t? ??a. ? ?n ho?c hu?ng cc s?n ph?m t? s?a t bo, ch?ng h?n nh? s?a ? b? kem ho?c s?a chua t bo. ? Trnh nh?ng mi?ng th?t nhi?u m?, th?t ? qua ch? bi?n ho?c th?t ??p mu?i v th?t gia c?m c da. Dnh kho?ng m?t ph?n t? ??a c?a qu v? cho cc protein khng m?, ch?ng h?n nh? c, th?t g khng da, ??u, tr?ng, ho?c ??u ph?. ? Trnh nh?ng th?c ph?m ch? bi?n ho?c lm s?n. Nh?ng th?c ph?m ny th??ng c nhi?u natri, ???ng ph? gia v ch?t bo h?n.  Gi?m l??ng dng natri hng ngy c?a qu v?. H?u h?t nh?ng ng??i b? t?ng huy?t p ??u nn ?n d??i 1.500 mg natri m?i ngy.  Khng u?ng r??u n?u: ? Chuyn gia ch?m [place] s?c kh?e c?a qu v? khuyn qu v? khng u?ng r??u. ? Qu v? c [place], c th? c [place], ho?c c k? ho?ch c [place].  N?u qu v? u?ng r??u: ? Gi?i h?n  l??ng r??u qu v? u?ng ? m?c:  0-1  ly/ngy ??i v?i n? gi?i.  0-2 ly/ngy ??i v?i nam gi?i. ? Bi?t r m?t ly c bao nhiu r??u. ? M?, m?t ly t??ng ???ng v?i m?t chai bia 12 ao x? (355 mL), m?t ly r??u vang 5 ao x? (148 mL), ho?c m?t ly r??u m?nh 1 ao x? (44 mL). L?i s?ng   H?p tc v?i chuyn gia ch?m [place] s?c kh?e c?a qu v? ?? duy tr tr?ng l??ng c? th? c l?i cho s?c kh?e ho?c ?? gi?m cn. Hy h?i xem tr?ng l??ng no l l t??ng cho qu v?.  T?p th? d?c t nh?t 30 pht h?u h?t cc ngy trong tu?n. Cc ho?t ??ng c th? bao g?m ?i b?, b?i, ho?c ??p xe.  Bao g?m bi t?p t?ng c??ng c? (bi t?p khng l?c), ch?ng h?n nh? bi t?p [place] ho?c nng t?, nh? m?t ph?n c?a thi quen luy?n t?p hng tu?n c?a qu v?. C? g?ng t?p nh?ng lo?i bi t?p ny trong vng 30 pht t?i thi?u 3 ngy m?i tu?n.  Khng s? d?ng b?t k? s?n ph?m no c nicotine ho?c thu?c l, ch?ng ha?n nh? thu?c l d?ng ht, thu?c l ?i?n t? v thu?c l d?ng nhai. N?u qu v? c?n gip ?? ?? cai thu?c, hy h?i chuyn gia ch?m [place] s?c kh?e.  Theo di huy?t p c?a qu v? t?i nh theo h??ng d?n c?a chuyn gia ch?m [place] s?c kh?e.  Tun th? t?t c? cc l?n khm theo di theo ch? d?n c?a chuyn gia ch?m [place] s?c kh?e. ?i?u ny c vai tr quan tr?ng. Thu?c  Ch? s? d?ng thu?c khng k ??n v thu?c k ??n theo ch? d?n c?a chuyn gia ch?m [place] s?c kh?e. Lm theo ch? d?n m?t cch c?n th?n. Thu?c ?i?u tr? huy?t p ph?i ???c dng theo ??n ? k.  Khng b? cc li?u thu?c huy?t p. Vi?c b? dng thu?c khi?n qu v? c nguy c? g?p ph?i cc v?n ?? v c th? lm cho thu?c gi?m hi?u qu?Marland Kitchen  Hy h?i chuyn gia ch?m [place] s?c kh?e c?a qu v? v? nh?ng tc d?ng ph? ho?c ph?n ?ng v?i thu?c m qu v? ph?i theo di. Hy lin l?c v?i chuyn gia ch?m [place] s?c kh?e n?u qu v?:  Ngh? qu v? c ph?n ?ng v?i thu?c ?ang dng.  B? ?au ??u ti?p t?c tr? l?i (ti pht).  C?m th?y chng m?t.  B? s?ng ph ? c? chn.  C v?n ?? v? th? l?c. Yu c?u tr? gip ngay l?p t?c n?u qu v?:  B? ?au ??u r?t nhi?u ho?c l  l?n.  B? y?u ho?c t b b?t th??ng.  C?m th?y ng?t x?u.  B? ?au r?t nhi?u ? ng?c ho?c b?ng.  Nn nhi?u l?n.  B? kh th?. Tm t?t  T?ng huy?t p l khi l?c b?m mu qua cc ??ng m?ch c?a qu v? qu m?nh. N?u tnh tr?ng ny khng ???c ki?m sot, n c th? khi?n qu v? c nguy c? v? cc bi?n ch?ng nghim tr?ng.  Huy?t p m?c tiu c nhn c?a qu v? c th? khc nhau ty thu?c v tnh tr?ng b?nh l, tu?i v cc nhn t? khc. ??i v?i h?u h?t m?i ng??i, huy?t p bnh th??ng l d??i 120/80.  ?i?u tr? t?ng huy?t p b?ng cch thay ??i l?i s?ng, dng thu?c, ho?c k?t h?p c? hai. Thay ??i l?i s?ng bao g?m gi?m cn, ?n ch? ?? ?  n c l?i cho s?c kh?e, t natri, t?p th? d?c nhi?u h?n v h?n ch? u?ng r??u. Thng tin ny khng nh?m m?c ?ch thay th? cho l?i khuyn m chuyn gia ch?m [place] s?c kh?e ni v?i qu v?. Hy b?o ??m qu v? ph?i th?o lu?n b?t k? v?n ?? g m qu v? c v?i chuyn gia ch?m [place] s?c kh?e c?a qu v?. Document Revised: 11/12/2017 Document Reviewed: 11/12/2017 Elsevier Patient Education  2020 [employer]      Edwina BarthMiguel Conda Wannamaker, MD [facility]

## 2019-09-20 NOTE — Assessment & Plan Note (Signed)
Well-controlled hypertension.  Continue present medication.  No changes.  Follow-up in 6 months.

## 2019-09-20 NOTE — Patient Instructions (Addendum)
If you have lab work done today you will be contacted with your lab results within the next 2 weeks.  If you have not heard from [place] then please contact us. The fastest way to get your results is to register for My Chart.   IF you received an x-ray today, you will receive an invoice from [facility]. Please contact [facility] at [phone] with questions or concerns regarding your invoice.   IF you received labwork today, you will receive an invoice from [facility]. Please contact [facility] at [phone] with questions or concerns regarding your invoice.   Our billing staff will not be able to assist you with questions regarding bills from these companies.  You will be contacted with the lab results as soon as they are available. The fastest way to get your results is to activate your My Chart account. Instructions are located on the last page of this paperwork. If you have not heard from [place] regarding the results in 2 weeks, please contact this office.     T?ng huy?t p, Ng??i l?n Hypertension, Adult Huy?t p cao (t?ng huy?t p) l khi l?c b?m mu qua ??ng m?ch qu m?nh. ??ng m?ch l cc m?ch mu mang mu t? tim ?i kh?p c? th?. T?ng huy?t p khi?n tim lm vi?c v?t v? h?n ?? b?m mu v c th? khi?n cc ??ng m?ch tr? ln h?p ho?c c?ng. T?ng huy?t p khng ???c ?i?u tr? ho?c ki?m sot c th? d?n t?i nh?i mu c? tim, suy tim, ??t qu?, b?nh th?n v nh?ng v?n ?? khc. Ch? s? ?o huy?t p g?m m?t ch? s? cao trn m?t ch? s? th?p. L t??ng l huy?t a?p cu?a quy? vi? c?n ph?i d??i 120/80. Ch? s? ??u tin ("??nh") ???c g?i l huy?t p tm thu. ?y l s? ?o p su?t trong ??ng m?ch khi tim qu v? ??p. Ch? s? th? hai ("?y") ???c g?i l huy?t p tm tr??ng. ?y l s? ?o p su?t trong ??ng m?ch khi tim qu v? ngh?Lourdes Sledge nhn g gy ra? Khng r nguyn nhn chnh xc gy ra tnh tr?ng ny. C m?t s? tnh tr?ng d?n ??n ho?c c lin quan ??n huy?t p cao. ?i?u g lm t?ng nguy c?? M?t  s? y?u t? nguy c? d?n ??n huy?t p cao c th? ki?m sot ???c. Nh?ng y?u t? sau c th? lm qu v? d? b? tnh tr?ng ny h?n:  Ht thu?c.  B? b?nh ti?u ???ng tup 2, cholesterol cao, ho?c c? hai.  Khng t?p th? d?c ho?c cc ho?t ??ng th? ch?t ??y ??Marland Kitchen  Th?a cn.  ?n qu nhi?u ch?t bo, ???ng, ca-lo, ho?c mu?i (Natri).  U?ng qu nhi?u r??u. M?t s? y?u t? nguy c? b? huy?t p cao c th? kh ho?c khng th? thay ??i. M?t s? y?u t? trong s? ny bao g?m:  B?nh th?n m?n tnh.  C ti?n s? gia ?nh b? cao huy?t p.  ?? tu?i. Nguy c? t?ng ln theo ?? tu?i.  Ch?ng t?c. Qu v? c nguy c? cao h?n n?u qu v? l ng??i M? g?c Phi.  Gi?i tnh. Nam gi?i c nguy c? cao h?n ph? n? tr??c tu?i 46. Sau tu?i 66, ph? n? c nguy c? cao h?n nam gi?i.  Ng?ng th? khi ng? do t?c ngh?n.  C?ng th?ng. Cc d?u hi?u ho?c tri?u ch?ng g? Huy?t p cao c th? khng gy ra cc tri?u ch?ng. Huy?t p r?t cao (c?n t?ng  huy?t p) c th? gy ra:  ?au ??u.  Lo u.  Kh th?.  Ch?y mu cam.  Bu?n nn v nn m?a.  Th? l?c thay ??i.  ?au ng?c d? d?i.  Co gi?t. Ch?n ?on tnh tr?ng ny nh? th? no? Tnh tr?ng ny ???c ch?n ?on b?ng cch ?o huy?t p c?a qu v? lc qu v? ng?i, ?? tay trn m?t b? m?t ph?ng, hai chn khng b?t cho, v hai bn chn b?ng ph?ng trn sn. B?ng qu?n thi?t b? ?o huy?t p s? ???c qu?n tr?c ti?p vo vng da cnh tay pha trn c?a qu v? ngang v?i m?c tim. Huy?t p c?n ???c ?o t nh?t hai l?n trn cng m?t cnh tay. M?t s? tnh tr?ng nh?t ??nh c th? lm cho huy?t p khc nhau gi?a tay ph?i v tay tri c?a qu v?. M?t s? y?u t? nh?t ??nh c th? khi?n ch? s? ?o huy?t p th?p h?n ho?c cao h?n so v?i bnh th??ng trong th?i gian ng?n:  Khi huy?t p c?a qu v? ? phng khm c?a chuyn gia ch?m sc s?c kh?e cao h?n so v?i lc qu v? ? nh, hi?n t??ng ny ???c g?i l t?ng huy?t p o chong tr?ng. H?u h?t nh?ng ng??i b? tnh tr?ng ny ??u khng c?n dng thu?c.  Khi huy?t p c?a qu v? lc ? nh cao h?n  so v?i lc qu v? ? phng khm chuyn gia ch?m sc s?c kh?e, hi?n t??ng ny ???c g?i l t?ng huy?t p ?n. H?u h?t nh?ng ng??i b? tnh tr?ng ny ??u c th? c?n dng thu?c ?? ki?m sot huy?t p. N?u qu v? c ch? s? huy?t p cao trong m?t l?n khm ho?c qu v? c huy?t p bnh th??ng c km cc y?u t? nguy c? khc, qu v? c th? ???c yu c?u:  Tr? l?i vo m?t ngy khc ?? ki?m tra l?i huy?t p.  Theo di huy?t p t?i nh trong vng 1 tu?n ho?c lu h?n. N?u qu v? ???c ch?n ?on b? t?ng huy?t p, qu v? c th? c?n th?c hi?n cc xt nghi?m mu ho?c ki?m tra hnh ?nh khc ?? gip chuyn gia ch?m sc s?c kh?e hi?u nguy c? t?ng th? m?c cc b?nh tr?ng khc. Tnh tr?ng ny ???c ?i?u tr? nh? th? no? Tnh tr?ng ny ???c ?i?u tr? b?ng cch thay ??i l?i s?ng lnh m?nh, ch?ng h?nh nh? ?n th?c ph?m c l?i cho s?c kh?e, t?p th? d?c nhi?u h?n v gi?m l??ng r??u u?ng vo. N?u thay ??i l?i s?ng khng ?? ?? ??a huy?t p v? m?c c th? ki?m sot ???c, chuyn gia ch?m sc s?c kh?e c th? k ??n thu?c, v n?u:  Huy?t p tm thu c?a qu v? trn 130.  Huy?t p tm tr??ng c?a qu v? trn 80. Huy?t p m?c tiu c nhn c?a qu v? c th? khc nhau ty thu?c v tnh tr?ng b?nh l, tu?i v cc nhn t? khc. Tun th? nh?ng h??ng d?n ny ? nh: ?n v u?ng   ?n ch? ?? giu ch?t x? v kali v t natri, ???ng ph? gia v ch?t bo. M?t k? ho?ch ?n m?u c tn l ch? ?? ?n DASH (Cch ti?p c?n ch? ?? ?n u?ng ?? ng?n ch?n t?ng huy?t p). ?n theo cch ny: ? ?n nhi?u tri cy v rau t??i. Vo m?i b?a ?n, c? g?ng dnh m?t n?a ??a cho tri cy v rau c?. ? ?n ng? c?c nguyn cm,   ch?ng h?n nh? m ?ng lm t? b?t m nguyn cm, g?o l?t, ho?c bnh m t? b?t m nguyn cm. Cho ngu? c?c nguyn ca?m va?o kho?ng m?t ph?n t? ??a. ? ?n ho?c hu?ng cc s?n ph?m t? s?a t bo, ch?ng h?n nh? s?a ? b? kem ho?c s?a chua t bo. ? Trnh nh?ng mi?ng th?t nhi?u m?, th?t ? qua ch? bi?n ho?c th?t ??p mu?i v th?t gia c?m c da. Dnh kho?ng m?t ph?n t? ??a  c?a qu v? cho cc protein khng m?, ch?ng h?n nh? c, th?t g khng da, ??u, tr?ng, ho?c ??u ph?. ? Trnh nh?ng th?c ph?m ch? bi?n ho?c lm s?n. Nh?ng th?c ph?m ny th??ng c nhi?u natri, ???ng ph? gia v ch?t bo h?n.  Gi?m l??ng dng natri hng ngy c?a qu v?. H?u h?t nh?ng ng??i b? t?ng huy?t p ??u nn ?n d??i 1.500 mg natri m?i ngy.  Khng u?ng r??u n?u: ? Chuyn gia ch?m sc s?c kh?e c?a qu v? khuyn qu v? khng u?ng r??u. ? Qu v? c thai, c th? c thai, ho?c c k? ho?ch c thai.  N?u qu v? u?ng r??u: ? Gi?i h?n l??ng r??u qu v? u?ng ? m?c:  0-1 ly/ngy ??i v?i n? gi?i.  0-2 ly/ngy ??i v?i nam gi?i. ? Bi?t r m?t ly c bao nhiu r??u. ? M?, m?t ly t??ng ???ng v?i m?t chai bia 12 ao x? (355 mL), m?t ly r??u vang 5 ao x? (148 mL), ho?c m?t ly r??u m?nh 1 ao x? (44 mL). L?i s?ng   H?p tc v?i chuyn gia ch?m sc s?c kh?e c?a qu v? ?? duy tr tr?ng l??ng c? th? c l?i cho s?c kh?e ho?c ?? gi?m cn. Hy h?i xem tr?ng l??ng no l l t??ng cho qu v?.  T?p th? d?c t nh?t 30 pht h?u h?t cc ngy trong tu?n. Cc ho?t ??ng c th? bao g?m ?i b?, b?i, ho?c ??p xe.  Bao g?m bi t?p t?ng c??ng c? (bi t?p khng l?c), ch?ng h?n nh? bi t?p [place] ho?c nng t?, nh? m?t ph?n c?a thi quen luy?n t?p hng tu?n c?a qu v?. C? g?ng t?p nh?ng lo?i bi t?p ny trong vng 30 pht t?i thi?u 3 ngy m?i tu?n.  Khng s? d?ng b?t k? s?n ph?m no c nicotine ho?c thu?c l, ch?ng ha?n nh? thu?c l d?ng ht, thu?c l ?i?n t? v thu?c l d?ng nhai. N?u qu v? c?n gip ?? ?? cai thu?c, hy h?i chuyn gia ch?m sc s?c kh?e.  Theo di huy?t p c?a qu v? t?i nh theo h??ng d?n c?a chuyn gia ch?m sc s?c kh?e.  Tun th? t?t c? cc l?n khm theo di theo ch? d?n c?a chuyn gia ch?m sc s?c kh?e. ?i?u ny c vai tr quan tr?ng. Thu?c  Ch? s? d?ng thu?c khng k ??n v thu?c k ??n theo ch? d?n c?a chuyn gia ch?m sc s?c kh?e. Lm theo ch? d?n m?t cch c?n th?n. Thu?c ?i?u tr? huy?t p ph?i ???c dng  theo ??n ? k.  Khng b? cc li?u thu?c huy?t p. Vi?c b? dng thu?c khi?n qu v? c nguy c? g?p ph?i cc v?n ?? v c th? lm cho thu?c gi?m hi?u qu?.  Hy h?i chuyn gia ch?m sc s?c kh?e c?a qu v? v? nh?ng tc d?ng ph? ho?c ph?n ?ng v?i thu?c m qu v? ph?i theo di. Hy lin l?c v?i chuyn gia ch?m sc s?c kh?e n?u qu v?:    Ngh? qu v? c ph?n ?ng v?i thu?c ?ang dng.  B? ?au ??u ti?p t?c tr? l?i (ti pht).  C?m th?y chng m?t.  B? s?ng ph ? c? chn.  C v?n ?? v? th? l?c. Yu c?u tr? gip ngay l?p t?c n?u qu v?:  B? ?au ??u r?t nhi?u ho?c l l?n.  B? y?u ho?c t b b?t th??ng.  C?m th?y ng?t x?u.  B? ?au r?t nhi?u ? ng?c ho?c b?ng.  Nn nhi?u l?n.  B? kh th?. Tm t?t  T?ng huy?t p l khi l?c b?m mu qua cc ??ng m?ch c?a qu v? qu m?nh. N?u tnh tr?ng ny khng ???c ki?m sot, n c th? khi?n qu v? c nguy c? v? cc bi?n ch?ng nghim tr?ng.  Huy?t p m?c tiu c nhn c?a qu v? c th? khc nhau ty thu?c v tnh tr?ng b?nh l, tu?i v cc nhn t? khc. ??i v?i h?u h?t m?i ng??i, huy?t p bnh th??ng l d??i 120/80.  ?i?u tr? t?ng huy?t p b?ng cch thay ??i l?i s?ng, dng thu?c, ho?c k?t h?p c? hai. Thay ??i l?i s?ng bao g?m gi?m cn, ?n ch? ?? ?n c l?i cho s?c kh?e, t natri, t?p th? d?c nhi?u h?n v h?n ch? u?ng r??u. Thng tin ny khng nh?m m?c ?ch thay th? cho l?i khuyn m chuyn gia ch?m [place] s?c kh?e ni v?i qu v?. Hy b?o ??m qu v? ph?i th?o lu?n b?t k? v?n ?? g m qu v? c v?i chuyn gia ch?m  s?c kh?e c?a qu v?. Document Revised: 11/12/2017 Document Reviewed: 11/12/2017 Elsevier Patient Education  2020 [employer].

## 2019-12-18 ENCOUNTER — Emergency Department (HOSPITAL_COMMUNITY): Payer: Managed Care, Other (non HMO)

## 2019-12-18 ENCOUNTER — Encounter (HOSPITAL_COMMUNITY): Payer: Self-pay | Admitting: Emergency Medicine

## 2019-12-18 ENCOUNTER — Emergency Department (HOSPITAL_COMMUNITY)
Admission: EM | Admit: 2019-12-18 | Discharge: 2019-12-18 | Disposition: A | Discharge status: Home / Self Care | Payer: Managed Care, Other (non HMO) | Attending: Emergency Medicine | Admitting: Emergency Medicine

## 2019-12-18 ENCOUNTER — Other Ambulatory Visit: Payer: Self-pay

## 2019-12-18 DIAGNOSIS — R1012 Left upper quadrant pain: Secondary | ICD-10-CM

## 2019-12-18 DIAGNOSIS — I10 Essential (primary) hypertension: Secondary | ICD-10-CM | POA: Insufficient documentation

## 2019-12-18 DIAGNOSIS — N3001 Acute cystitis with hematuria: Secondary | ICD-10-CM

## 2019-12-18 DIAGNOSIS — Z79899 Other long term (current) drug therapy: Secondary | ICD-10-CM | POA: Insufficient documentation

## 2019-12-18 LAB — URINALYSIS, ROUTINE W REFLEX MICROSCOPIC
Bilirubin Urine: NEGATIVE
Glucose, UA: NEGATIVE mg/dL
Ketones, ur: NEGATIVE mg/dL
Nitrite: NEGATIVE
Protein, ur: NEGATIVE mg/dL
RBC / HPF: >50 RBC/hpf — ABNORMAL HIGH (ref 0–5)
Specific Gravity, Urine: 1.025 (ref 1.005–1.030)
pH: 5 (ref 5.0–8.0)

## 2019-12-18 LAB — COMPREHENSIVE METABOLIC PANEL
ALT: 15 U/L (ref 0–44)
AST: 17 U/L (ref 15–41)
Albumin: 4.2 g/dL (ref 3.5–5.0)
Alkaline Phosphatase: 41 U/L (ref 38–126)
Anion gap: 10 (ref 5–15)
BUN: 29 mg/dL — ABNORMAL HIGH (ref 6–20)
CO2: 23 mmol/L (ref 22–32)
Calcium: 9 mg/dL (ref 8.9–10.3)
Chloride: 103 mmol/L (ref 98–111)
Creatinine, Ser: 0.94 mg/dL (ref 0.44–1.00)
GFR, Estimated: >60 mL/min (ref >60)
Glucose, Bld: 120 mg/dL — ABNORMAL HIGH (ref 70–99)
Potassium: 4.1 mmol/L (ref 3.5–5.1)
Sodium: 136 mmol/L (ref 135–145)
Total Bilirubin: 0.2 mg/dL — ABNORMAL LOW (ref 0.3–1.2)
Total Protein: 7.8 g/dL (ref 6.5–8.1)

## 2019-12-18 LAB — CBC
HCT: 37.7 % (ref 36.0–46.0)
Hemoglobin: 12.7 g/dL (ref 12.0–15.0)
MCH: 30.5 pg (ref 26.0–34.0)
MCHC: 33.7 g/dL (ref 30.0–36.0)
MCV: 90.4 fL (ref 80.0–100.0)
Platelets: 273 10*3/uL (ref 150–400)
RBC: 4.17 MIL/uL (ref 3.87–5.11)
RDW: 12.5 % (ref 11.5–15.5)
WBC: 9.9 10*3/uL (ref 4.0–10.5)
nRBC: 0 % (ref 0.0–0.2)

## 2019-12-18 LAB — I-STAT BETA HCG BLOOD, ED (MC, WL, AP ONLY): I-stat hCG, quantitative: <5 m[IU]/mL (ref <5)

## 2019-12-18 LAB — LIPASE, BLOOD: Lipase: 27 U/L (ref 11–51)

## 2019-12-18 MED ORDER — IOHEXOL 300 MG/ML  SOLN
100.0000 mL | Freq: Once | INTRAMUSCULAR | Status: AC | PRN
  Administered 2019-12-18: 100 mL via INTRAVENOUS

## 2019-12-18 MED ORDER — HYDROMORPHONE HCL 1 MG/ML IJ SOLN
0.5000 mg | Freq: Once | INTRAMUSCULAR | Status: AC
  Administered 2019-12-18: 0.5 mg via INTRAVENOUS
  Filled 2019-12-18: qty 1

## 2019-12-18 MED ORDER — ONDANSETRON HCL 4 MG/2ML IJ SOLN
4.0000 mg | Freq: Once | INTRAMUSCULAR | Status: AC
  Administered 2019-12-18: 4 mg via INTRAVENOUS
  Filled 2019-12-18: qty 2

## 2019-12-18 MED ORDER — OMEPRAZOLE 40 MG PO CPDR: 40.0000 mg | DELAYED_RELEASE_CAPSULE | Freq: Every day | ORAL | 0 refills | Status: AC

## 2019-12-18 MED ORDER — SODIUM CHLORIDE (PF) 0.9 % IJ SOLN
INTRAMUSCULAR | Status: AC
  Filled 2019-12-18: qty 50

## 2019-12-18 MED ORDER — SODIUM CHLORIDE 0.9 % IV BOLUS
1000.0000 mL | Freq: Once | INTRAVENOUS | Status: AC
  Administered 2019-12-18: 1000 mL via INTRAVENOUS

## 2019-12-18 MED ORDER — CEPHALEXIN 500 MG PO CAPS: 500.0000 mg | ORAL_CAPSULE | Freq: Two times a day (BID) | ORAL | 0 refills | Status: AC

## 2019-12-18 NOTE — ED Triage Notes (Addendum)
Pt reports LUQ abdominal pain around the base of her ribs that started about 2 hours ago. She states that this pain has happened before, but this time it has not subsided. Laying down makes the pain worse. Denies vomiting or diarrhea. Hx of HTN.

## 2019-12-18 NOTE — ED Provider Notes (Signed)
[facility]-EMERGENCY DEPT Provider Note   CSN: [id number] Arrival date & time: 12/18/19  0540     History Chief Complaint  Patient presents with   Abdominal Pain    [name] is a 49 y.o. female.  49 year old female with past medical history of hypertension presents with complaint of left upper quadrant abdominal pain.  Patient's daughter is here and translates for her, reports left upper quadrant pain described as a tightness, intermittent, worse with palpation, onset at 4 AM today, waking patient from her sleep, radiates around left side.  Patient reports feeling bloated at times, constipated at times however not now, denies any associated nausea, vomiting, changes in bowel or bladder habits at this time, denies chest pain or shortness of breath.  Patient states that she has had this pain from time to time, has tried to monitor her diet however unable to identify what is triggering her pain and bloated discomfort.  No known sick contacts, no recent travel, prior abdominal surgery includes C-section.  No other complaints at this time.        Past Medical History:  Diagnosis Date   Hypertension     Patient Active Problem List   Diagnosis Date Noted   Chronic tension-type headache, not intractable 03/15/2018   Essential hypertension 03/26/2010    Past Surgical History:  Procedure Laterality Date   CESAREAN SECTION     TUBAL LIGATION       OB History    Gravida  4   Para  4   Term  4   Preterm  0   AB  0   Living  3     SAB  0   TAB  0   Ectopic  0   Multiple  0   Live Births              History reviewed. No pertinent family history.  Social History   Tobacco Use   Smoking status: Never Smoker   Smokeless tobacco: Never Used  Substance Use Topics   Alcohol use: No    Alcohol/week: 0.0 standard drinks   Drug use: No    Home Medications Prior to Admission medications   Medication Sig Start Date End Date  Taking? Authorizing Provider  cephALEXin (KEFLEX) 500 MG capsule Take 1 capsule (500 mg total) by mouth 2 (two) times daily for 3 days. 12/18/19 12/21/19  Jeannie Fend, PA-C  lisinopril-hydrochlorothiazide (ZESTORETIC) 10-12.5 MG tablet Take 1 tablet by mouth daily. 09/20/19   Georgina Quint, MD  meloxicam (MOBIC) 15 MG tablet Take 0.5-1 tablets (7.5-15 mg total) by mouth daily as needed for pain. Patient not taking: Reported on 09/20/2019 07/12/19   Hilts, Casimiro Needle, MD  omeprazole (PRILOSEC) 40 MG capsule Take 1 capsule (40 mg total) by mouth daily. 12/18/19 01/17/20  Jeannie Fend, PA-C    Allergies    Shellfish allergy and Chicken (grilled) flavor [flavoring agent]  Review of Systems   Review of Systems  Constitutional: Negative for chills, diaphoresis and fever.  Respiratory: Negative for shortness of breath.   Cardiovascular: Negative for chest pain.  Gastrointestinal: Positive for abdominal pain. Negative for blood in stool, constipation, diarrhea, nausea and vomiting.  Genitourinary: Negative for dysuria and frequency.  Musculoskeletal: Negative for arthralgias and myalgias.  Skin: Negative for rash and wound.  Allergic/Immunologic: Negative for immunocompromised state.  Neurological: Negative for weakness.  Psychiatric/Behavioral: Negative for confusion.  All other systems reviewed and are negative.   Physical Exam  Updated Vital Signs BP 120/72 (BP Location: Right Arm)    Pulse (!) 56    Temp 97.8 F (36.6 C) (Oral)    Resp 18    Ht 4\' 10"  (1.473 m)    Wt 66.7 kg    SpO2 100%    BMI 30.73 kg/m   Physical Exam Vitals and nursing note reviewed.  Constitutional:      General: She is not in acute distress.    Appearance: She is well-developed. She is not diaphoretic.  HENT:     Head: Normocephalic and atraumatic.  Cardiovascular:     Rate and Rhythm: Normal rate and regular rhythm.     Heart sounds: Normal heart sounds.  Pulmonary:     Effort: Pulmonary effort is  normal.     Breath sounds: Normal breath sounds.  Abdominal:     Palpations: Abdomen is soft.     Tenderness: There is generalized abdominal tenderness. There is no right CVA tenderness or left CVA tenderness.     Hernia: No hernia is present.  Skin:    General: Skin is warm and dry.     Findings: No erythema or rash.  Neurological:     Mental Status: She is alert and oriented to person, place, and time.  Psychiatric:        Behavior: Behavior normal.     ED Results / Procedures / Treatments   Labs (all labs ordered are listed, but only abnormal results are displayed) Labs Reviewed  COMPREHENSIVE METABOLIC PANEL - Abnormal; Notable for the following components:      Result Value   Glucose, Bld 120 (*)    BUN 29 (*)    Total Bilirubin 0.2 (*)    All other components within normal limits  URINALYSIS, ROUTINE W REFLEX MICROSCOPIC - Abnormal; Notable for the following components:   Hgb urine dipstick LARGE (*)    Leukocytes,Ua TRACE (*)    RBC / HPF >50 (*)    Bacteria, UA RARE (*)    All other components within normal limits  LIPASE, BLOOD  CBC  I-STAT BETA HCG BLOOD, ED ([facility], [facility], [facility] ONLY)    EKG EKG Interpretation  Date/Time:  Monday December 18 2019 06:27:09 EDT Ventricular Rate:  57 PR Interval:    QRS Duration: 91 QT Interval:  386 QTC Calculation: 376 R Axis:   78 Text Interpretation: Sinus rhythm Borderline T wave abnormalities No significant change was found Confirmed by [id number] (Paula Libra) on 12/18/2019 6:32:51 AM   Radiology No results found.  Procedures Procedures (including critical care time)  Medications Ordered in ED Medications  sodium chloride 0.9 % bolus 1,000 mL (0 mLs Intravenous Stopped 12/18/19 1012)  HYDROmorphone (DILAUDID) injection 0.5 mg (0.5 mg Intravenous Given 12/18/19 0725)  ondansetron (ZOFRAN) injection 4 mg (4 mg Intravenous Given 12/18/19 0725)  iohexol (OMNIPAQUE) 300 MG/ML solution 100 mL (100 mLs Intravenous Contrast Given  12/18/19 0757)  sodium chloride (PF) 0.9 % injection (  Given 12/18/19 13/1/21)    ED Course  I have reviewed the triage vital signs and the nursing notes.  Pertinent labs & imaging results that were available during my care of the patient were reviewed by me and considered in my medical decision making (see chart for details).  Clinical Course as of Dec 18 1123  Mon Dec 18, 2019  1044 CT report has not crossed over from [facility]. Per report: IMPRESSION: 1. No acute finding. 2. Numerous renal cysts only seen on the left.   [  LM]  2272 49 year old female with complaint of recurrent left upper abdominal pain. On exam, tenderness to left abdomen, abdomen is otherwise soft.  CT without acute findings. CBC and CMP without significant findings, lipase WNL, hcg negative. UA with  leukocytes, 6-10 WBC and rare bacteria. Also large hgb and RBC. No definitive source of patient's pain identified today. Plan is to treat UTI, recommend recheck with PCP to be sure blood clears from urine. Patient has been taking Prilosec PRN without report of improvement in her symptoms. Recommend daily use, not PRN, will give rx. If pain continues with daily use, advised patient to consider follow up with GI.    [LM]    Clinical Course User Index [LM] Alden Hipp   MDM Rules/Calculators/A&P                          Final Clinical Impression(s) / ED Diagnoses Final diagnoses:  Left upper quadrant abdominal pain  Acute cystitis with hematuria    Rx / DC Orders ED Discharge Orders         Ordered    omeprazole (PRILOSEC) 40 MG capsule  Daily        12/18/19 1112    cephALEXin (KEFLEX) 500 MG capsule  2 times daily        12/18/19 1112           Jeannie Fend, PA-C 12/18/19 1125    Linwood Dibbles, MD 12/19/19 [phone]

## 2019-12-18 NOTE — Discharge Instructions (Signed)
Take Prilosec every day as prescribed. Take Keflex twice daily for 3 days as prescribed for infection in the urine.  Take Metamucil or MiraLAX daily to help with gas pain and keep bowels moving. Recheck with your primary care provider at the end of the week to make sure blood and infection have cleared from your urine.  If you continue to have abdominal pain, you may need referral to GI for further evaluation as discussed.

## 2020-02-07 ENCOUNTER — Other Ambulatory Visit: Payer: Self-pay | Admitting: Family Medicine

## 2020-02-22 ENCOUNTER — Ambulatory Visit: Payer: Managed Care, Other (non HMO) | Admitting: Family Medicine

## 2020-02-29 ENCOUNTER — Other Ambulatory Visit: Payer: Self-pay

## 2020-02-29 ENCOUNTER — Ambulatory Visit (INDEPENDENT_AMBULATORY_CARE_PROVIDER_SITE_OTHER): Payer: Self-pay | Admitting: Family Medicine

## 2020-02-29 ENCOUNTER — Encounter: Payer: Self-pay | Admitting: Family Medicine

## 2020-02-29 VITALS — BP 108/64 | HR 69 | Temp 98.2°F | Resp 14 | Ht <= 58 in | Wt 149.2 lb

## 2020-02-29 DIAGNOSIS — R140 Abdominal distension (gaseous): Secondary | ICD-10-CM

## 2020-02-29 DIAGNOSIS — K5904 Chronic idiopathic constipation: Secondary | ICD-10-CM

## 2020-02-29 NOTE — Patient Instructions (Addendum)
Drink lots of water.  Eat lots of green vegetables and other vegetables and fruit.  Avoid too much fried and fatty food  Continue taking the MiraLAX 1 dose daily, and decrease the dose to once every 2 or 3 days when you are moving well.  Keep taking it until the stools are almost loose.  Take over-the-counter Dulcolax 1 to 3 tablets daily.  I would start with 2 daily.  If it is difficult making the stool come out of the rectum, also use a glycerin suppository.  Inserted into the rectum and keep it in there for 1/2-hour or so before trying to poop.  If after several days none of the above is working after several days you can use a fleets enema for 2 or 3 days if necessary.  In the event of severe abdominal pain go to the emergency room.  I suspect the dark stool was from Pepto-Bismol.  Referral is being made to a gastroenterologist for a colonoscopy.   If you have lab work done today you will be contacted with your lab results within the next 2 weeks.  If you have not heard from [place] then please contact us. The fastest way to get your results is to register for My Chart.   IF you received an x-ray today, you will receive an invoice from [facility]. Please contact [facility] at [phone] with questions or concerns regarding your invoice.   IF you received labwork today, you will receive an invoice from [place]. Please contact [facility] at [phone] with questions or concerns regarding your invoice.   Our billing staff will not be able to assist you with questions regarding bills from these companies.  You will be contacted with the lab results as soon as they are available. The fastest way to get your results is to activate your My Chart account. Instructions are located on the last page of this paperwork. If you have not heard from [place] regarding the results in 2 weeks, please contact this office.

## 2020-02-29 NOTE — Progress Notes (Signed)
Patient ID: [name], female    DOB: 1970/05/17  Age: 50 y.o. MRN: [id number]  Chief Complaint  Patient presents with  . Abdominal Pain    Pt reports bloating and painful starting last few days, tried gasX, tums, and pepto but none of these helped, stools are very dark color -did eat purple potato yesterday which can color the stool but is unsure     Subjective:   Patient has been having problems with the above symptoms for a long time.  MiraLAX used to work, but it does not seem to help the last couple of days.  She does not take it regularly.  She gets foul smelling and tasting burps when she cannot move her bowels.  Has not passed any blood but did have some dark stool.  However that followed having used some Pepto-Bismol.  She never has had a colonoscopy.  We discussed the need for that.  Does not eat a lot of bananas or cheese.  Current allergies, medications, problem list, past/family and social histories reviewed.  Objective:  BP 108/64   Pulse 69   Temp 98.2 F (36.8 C) (Temporal)   Resp 14   Ht '4\' 10"'  (1.473 m)   Wt 149 lb 3.2 oz (67.7 kg)   SpO2 97%   BMI 31.18 kg/m   No major distress.  Chest clear.  Heart rate without murmur, gallop, or arrhythmia.  Abdomen has normal bowel sounds but she says it rumbles at times.  Soft, but is full on the left side of the abdomen.  She is tender from around the area of the spleen down to the lower abdomen. Assessment & Plan:   Assessment: 1. Chronic idiopathic constipation   2. Abdominal bloating       Plan: See instructions  Orders Placed This Encounter  Procedures  . CBC  . CMP14+EGFR  . TSH  . Ambulatory referral to Gastroenterology    Referral Priority:   Routine    Referral Type:   Consultation    Referral Reason:   Specialty Services Required    Referred to Provider:   Carol Ada, MD    Number of Visits Requested:   1    No orders of the defined types were placed in this encounter.        Patient  Instructions    Drink lots of water.  Eat lots of green vegetables and other vegetables and fruit.  Avoid too much fried and fatty food  Continue taking the MiraLAX 1 dose daily, and decrease the dose to once every 2 or 3 days when you are moving well.  Keep taking it until the stools are almost loose.  Take over-the-counter Dulcolax 1 to 3 tablets daily.  I would start with 2 daily.  If it is difficult making the stool come out of the rectum, also use a glycerin suppository.  Inserted into the rectum and keep it in there for 1/2-hour or so before trying to poop.  If after several days none of the above is working after several days you can use a fleets enema for 2 or 3 days if necessary.  In the event of severe abdominal pain go to the emergency room.  I suspect the dark stool was from Pepto-Bismol.  Referral is being made to a gastroenterologist for a colonoscopy.   If you have lab work done today you will be contacted with your lab results within the next 2 weeks.  If you have not heard  from [place] then please contact us. The fastest way to get your results is to register for My Chart.   IF you received an x-ray today, you will receive an invoice from [facility]. Please contact [facility] Radiology at [phone] with questions or concerns regarding your invoice.   IF you received labwork today, you will receive an invoice from [facility]. Please contact [facility] at [phone] with questions or concerns regarding your invoice.   Our billing staff will not be able to assist you with questions regarding bills from these companies.  You will be contacted with the lab results as soon as they are available. The fastest way to get your results is to activate your My Chart account. Instructions are located on the last page of this paperwork. If you have not heard from [place] regarding the results in 2 weeks, please contact this office.        No follow-ups on file.   Ruben Reason, MD 02/29/2020

## 2020-03-01 LAB — CMP14+EGFR
ALT: 19 IU/L (ref 0–32)
AST: 18 IU/L (ref 0–40)
Albumin/Globulin Ratio: 1.6 (ref 1.2–2.2)
Albumin: 4.2 g/dL (ref 3.8–4.8)
Alkaline Phosphatase: 56 IU/L (ref 44–121)
BUN/Creatinine Ratio: 18 (ref 9–23)
BUN: 18 mg/dL (ref 6–24)
Bilirubin Total: 0.3 mg/dL (ref 0.0–1.2)
CO2: 25 mmol/L (ref 20–29)
Calcium: 8.6 mg/dL — ABNORMAL LOW (ref 8.7–10.2)
Chloride: 102 mmol/L (ref 96–106)
Creatinine, Ser: 1.02 mg/dL — ABNORMAL HIGH (ref 0.57–1.00)
GFR calc Af Amer: 75 mL/min/{1.73_m2} (ref >59)
GFR calc non Af Amer: 65 mL/min/{1.73_m2} (ref >59)
Globulin, Total: 2.7 g/dL (ref 1.5–4.5)
Glucose: 94 mg/dL (ref 65–99)
Potassium: 3.8 mmol/L (ref 3.5–5.2)
Sodium: 140 mmol/L (ref 134–144)
Total Protein: 6.9 g/dL (ref 6.0–8.5)

## 2020-03-01 LAB — CBC
Hematocrit: 37.1 % (ref 34.0–46.6)
Hemoglobin: 12.4 g/dL (ref 11.1–15.9)
MCH: 29.7 pg (ref 26.6–33.0)
MCHC: 33.4 g/dL (ref 31.5–35.7)
MCV: 89 fL (ref 79–97)
Platelets: 268 10*3/uL (ref 150–450)
RBC: 4.18 x10E6/uL (ref 3.77–5.28)
RDW: 12.4 % (ref 11.7–15.4)
WBC: 8.6 10*3/uL (ref 3.4–10.8)

## 2020-03-01 LAB — TSH: TSH: 1.17 u[IU]/mL (ref 0.450–4.500)

## 2020-03-06 NOTE — Progress Notes (Signed)
Called pt and her daughter answered and will relay the message.

## 2020-03-20 ENCOUNTER — Ambulatory Visit: Payer: Managed Care, Other (non HMO) | Admitting: Emergency Medicine

## 2020-04-16 ENCOUNTER — Telehealth: Payer: Self-pay | Admitting: *Deleted

## 2020-04-16 NOTE — Telephone Encounter (Signed)
Transition Care Management Unsuccessful Follow-up Telephone Call  Date of discharge and from where: 04-15-2020  Attempts:  1  Reason for unsuccessful TCM follow-up call:  No answer voice mail full.  Used interpretor service patient speaks [place]

## 2020-04-17 ENCOUNTER — Telehealth: Payer: Self-pay | Admitting: *Deleted

## 2020-04-17 NOTE — Telephone Encounter (Signed)
Transition Care Management Unsuccessful Follow-up Telephone Call  Date of discharge and from where:  04/15/2020 - [facility]  Attempts:  2nd Attempt  Reason for unsuccessful TCM follow-up call:  Left voice message via [place] language interpreter

## 2020-04-18 NOTE — Telephone Encounter (Signed)
Transition Care Management Unsuccessful Follow-up Telephone Call  Date of discharge and from where:  04/15/2020 - [facility]  Attempts:  3rd Attempt  Reason for unsuccessful TCM follow-up call:  Left voice message via [place] language interpreter

## 2020-09-06 ENCOUNTER — Other Ambulatory Visit: Payer: Self-pay | Admitting: Family Medicine

## 2020-12-21 ENCOUNTER — Other Ambulatory Visit: Payer: Self-pay | Admitting: Emergency Medicine

## 2020-12-21 DIAGNOSIS — I10 Essential (primary) hypertension: Secondary | ICD-10-CM

## 2021-03-27 ENCOUNTER — Other Ambulatory Visit: Payer: Self-pay | Admitting: Emergency Medicine

## 2021-03-27 DIAGNOSIS — I10 Essential (primary) hypertension: Secondary | ICD-10-CM

## 2021-04-30 ENCOUNTER — Other Ambulatory Visit: Payer: Self-pay | Admitting: Emergency Medicine

## 2021-04-30 DIAGNOSIS — I10 Essential (primary) hypertension: Secondary | ICD-10-CM

## 2021-05-26 ENCOUNTER — Other Ambulatory Visit: Payer: Self-pay | Admitting: Emergency Medicine

## 2021-05-26 DIAGNOSIS — I10 Essential (primary) hypertension: Secondary | ICD-10-CM

## 2021-06-19 ENCOUNTER — Other Ambulatory Visit: Payer: Self-pay | Admitting: Emergency Medicine

## 2021-06-19 DIAGNOSIS — I10 Essential (primary) hypertension: Secondary | ICD-10-CM

## 2021-07-09 ENCOUNTER — Other Ambulatory Visit: Payer: Self-pay | Admitting: Emergency Medicine

## 2021-07-09 DIAGNOSIS — I10 Essential (primary) hypertension: Secondary | ICD-10-CM

## 2022-01-20 ENCOUNTER — Telehealth: Payer: Self-pay

## 2022-01-20 NOTE — Telephone Encounter (Signed)
Transition Care Management Follow-up Telephone Call Date of discharge and from where: [facility] on 01/19/2022 Dx: LUQ Pain How have you been since you were released from the hospital? "Still waiting on medication" Any questions or concerns? Yes; need dietary changes  Items Reviewed: Did the pt receive and understand the discharge instructions provided? Yes  Medications obtained and verified? No ; still waiting to get medication per daughter Other? No  Any new allergies since your discharge? No  Dietary orders reviewed? No Do you have support at home? Yes  family  Home Care and Equipment/Supplies: Were home health services ordered? no If so, what is the name of the agency? no  Has the agency set up a time to come to the patient's home? no Were any new equipment or medical supplies ordered?  No What is the name of the medical supply agency? no Were you able to get the supplies/equipment? not applicable Do you have any questions related to the use of the equipment or supplies? No  Functional Questionnaire: (I = Independent and D = Dependent) ADLs: I  Bathing/Dressing- I  Meal Prep- I  Eating- I  Maintaining continence- I  Transferring/Ambulation- I  Managing Meds- I  Follow up appointments reviewed:  PCP Hospital f/u appt confirmed? Yes  Scheduled to see Edwina Barth, MD. on 01/21/2022 @ 2:20 pm. Specialist Hospital f/u appt confirmed? Yes  Scheduled to see Gastroenterology  Are transportation arrangements needed? No  If their condition worsens, is the pt aware to call PCP or go to the Emergency Dept.? Yes Was the patient provided with contact information for the PCP's office or ED? Yes Was to pt encouraged to call back with questions or concerns? Yes

## 2022-01-21 ENCOUNTER — Telehealth: Payer: Self-pay | Admitting: Emergency Medicine

## 2022-01-21 ENCOUNTER — Ambulatory Visit: Payer: Managed Care, Other (non HMO) | Admitting: Emergency Medicine

## 2022-01-21 NOTE — Telephone Encounter (Signed)
Pt called to cancel appt today (01/21/22). Pt states she has a new PCP:  PCP-Morrison, Rachel Bo, FNP [id number] [place], [place] [address] [phone] (Work) [phone] (Fax)
